# Patient Record
Sex: Female | Born: 1950 | Race: Black or African American | Hispanic: No | Marital: Married | Smoking: Never smoker
Health system: Southern US, Community
[De-identification: ages and names within clinical notes are randomized; demographics above are authoritative.]

## PROBLEM LIST (undated history)

## (undated) DIAGNOSIS — F419 Anxiety disorder, unspecified: Secondary | ICD-10-CM

## (undated) DIAGNOSIS — I10 Essential (primary) hypertension: Secondary | ICD-10-CM

## (undated) DIAGNOSIS — F32A Depression, unspecified: Secondary | ICD-10-CM

## (undated) DIAGNOSIS — E119 Type 2 diabetes mellitus without complications: Secondary | ICD-10-CM

## (undated) DIAGNOSIS — I2510 Atherosclerotic heart disease of native coronary artery without angina pectoris: Secondary | ICD-10-CM

## (undated) DIAGNOSIS — J45909 Unspecified asthma, uncomplicated: Secondary | ICD-10-CM

## (undated) HISTORY — PX: APPENDECTOMY: SHX54

## (undated) HISTORY — DX: Anxiety disorder, unspecified: F41.9

## (undated) HISTORY — DX: Depression, unspecified: F32.A

## (undated) HISTORY — DX: Essential (primary) hypertension: I10

---

## 2016-10-21 DIAGNOSIS — E0590 Thyrotoxicosis, unspecified without thyrotoxic crisis or storm: Secondary | ICD-10-CM | POA: Diagnosis not present

## 2016-10-21 DIAGNOSIS — E559 Vitamin D deficiency, unspecified: Secondary | ICD-10-CM | POA: Diagnosis not present

## 2016-10-21 DIAGNOSIS — Z79899 Other long term (current) drug therapy: Secondary | ICD-10-CM | POA: Diagnosis not present

## 2016-10-21 DIAGNOSIS — I10 Essential (primary) hypertension: Secondary | ICD-10-CM | POA: Diagnosis not present

## 2016-10-21 DIAGNOSIS — R7309 Other abnormal glucose: Secondary | ICD-10-CM | POA: Diagnosis not present

## 2016-10-21 DIAGNOSIS — E7800 Pure hypercholesterolemia, unspecified: Secondary | ICD-10-CM | POA: Diagnosis not present

## 2016-11-03 DIAGNOSIS — Z79899 Other long term (current) drug therapy: Secondary | ICD-10-CM | POA: Diagnosis not present

## 2016-11-03 DIAGNOSIS — F209 Schizophrenia, unspecified: Secondary | ICD-10-CM | POA: Diagnosis not present

## 2016-11-03 DIAGNOSIS — I10 Essential (primary) hypertension: Secondary | ICD-10-CM | POA: Diagnosis not present

## 2016-11-03 DIAGNOSIS — R441 Visual hallucinations: Secondary | ICD-10-CM | POA: Diagnosis not present

## 2016-11-03 DIAGNOSIS — F17200 Nicotine dependence, unspecified, uncomplicated: Secondary | ICD-10-CM | POA: Diagnosis not present

## 2016-11-03 DIAGNOSIS — F989 Unspecified behavioral and emotional disorders with onset usually occurring in childhood and adolescence: Secondary | ICD-10-CM | POA: Diagnosis not present

## 2016-11-03 DIAGNOSIS — F419 Anxiety disorder, unspecified: Secondary | ICD-10-CM | POA: Diagnosis not present

## 2016-11-03 DIAGNOSIS — F29 Unspecified psychosis not due to a substance or known physiological condition: Secondary | ICD-10-CM | POA: Diagnosis not present

## 2016-11-03 DIAGNOSIS — R440 Auditory hallucinations: Secondary | ICD-10-CM | POA: Diagnosis not present

## 2016-11-04 ENCOUNTER — Ambulatory Visit (INDEPENDENT_AMBULATORY_CARE_PROVIDER_SITE_OTHER): Payer: Medicare (Managed Care) | Admitting: Psychiatry

## 2016-11-04 ENCOUNTER — Encounter (HOSPITAL_BASED_OUTPATIENT_CLINIC_OR_DEPARTMENT_OTHER): Payer: Self-pay

## 2016-11-04 VITALS — BP 148/88 | HR 97 | Temp 97.7°F | Ht 60.0 in | Wt 152.0 lb

## 2016-11-04 DIAGNOSIS — F22 Delusional disorders: Secondary | ICD-10-CM

## 2016-11-04 DIAGNOSIS — F411 Generalized anxiety disorder: Secondary | ICD-10-CM

## 2016-11-04 MED ORDER — HALOPERIDOL 2 MG PO TABS
2.0000 mg | ORAL_TABLET | Freq: Every evening | ORAL | 2 refills | Status: DC
Start: 2016-11-04 — End: 2017-01-18

## 2016-11-04 MED ORDER — BUSPIRONE HCL 15 MG PO TABS
15.0000 mg | ORAL_TABLET | Freq: Two times a day (BID) | ORAL | 2 refills | Status: DC
Start: 2016-11-04 — End: 2017-01-18

## 2016-11-04 NOTE — Patient Instructions (Signed)
MEDICATION INSTRUCTIONS                    1. Take all medications as prescribed; do not stop medications or change dosages without talking to your provider(s).  2. Abstain from alcohol and/or illegal drugs as they interfere with psychiatric medications.   3. Immediately go to the nearest emergency department or call 911 if you have any thoughts of wanting to harm yourself or others, or for any other crisis.  4. Consult with your pharmacist if you questions about your medications, their side effects or possible interactions with other medications you take.      FOLLOW-UP CARE APPOINTMENTS    PSYCHIATRIC MEDICATION MANAGEMENT: Start taking Haldol 2 mg one pill at night. and Continue taking Buspar 15 mg twice a day  1. PSYCHOTHERAPY: If you do not already have a therapist, find one by contact your insurance provider for a list of in-network providers. Time-limited, psychotherapy programs are available through [place] by calling [phone]. Another source for therapists is http://www.psychology-today.com/.  2. Return to [facility] in prn, or anytime if necessary (but not for routine matters like refills). [facility] Walk-in hours are 10a-6p Monday thru Saturday, excluding Thanksgiving, Christmas and New Years Day. We close early at 3pm on the first Wednesday of each month for staff meetings.      ABOUT YOUR MEDICATIONS:     Haloperidol Oral tablet  What is this medicine?  HALOPERIDOL (ha loe PER i dole) is used to treat schizophrenia. This medicine is also used to control tics and vocal outbursts in patients with Tourette's syndrome and treat behavioral problems in children with severe conduct disorders. It should only be used in these children if other medicines have not worked.  This medicine may be used for other purposes; ask your health care provider or pharmacist if you have questions.  What should I tell my health care provider before I take this medicine?  They need to know if you have any of these  conditions:   dementia   head injury   lung disease   Parkinson's disease   an unusual or allergic reaction to haloperidol, tartrazine, other medicines, foods, dyes, or preservatives   pregnant or trying to get pregnant   breast-feeding  How should I use this medicine?  Take this medicine by mouth with a glass of water. Follow the directions on the prescription label. You can take this medicine with or without food. Take your doses at regular intervals. Do not take your medicine more often than directed. Do not suddenly stop taking this medicine. You may need to gradually reduce the dose.  Talk to your pediatrician regarding the use of this medicine in children. Special care may be needed. While this medicine may be prescribed for children for selected conditions, precautions do apply.  Overdosage: If you think you have taken too much of this medicine contact a poison control center or emergency room at once.  NOTE: This medicine is only for you. Do not share this medicine with others.  What if I miss a dose?  If you miss a dose, take it as soon as you can. If it is almost time for your next dose, take only that dose. Do not take double or extra doses.  What may interact with this medicine?  Do not take this medicine with any of the following medications:   arsenic trioxide   certain antibiotics like grepafloxacin, pentamidine, sparfloxacin   certain medicines for fungal infections like  fluconazole, itraconazole, ketoconazole, posaconazole, voriconazole   certain medicines for malaria like chloroquine, halofantrine   certain medicines for irregular heart beat like dofetilide, dronedarone   cisapride   droperidol   levomethadyl   methadone   pimozide   ranolazine   risperidone   thioridazine   ziprasidone  This medicine may also interact with the following medications:   alcohol   atropine   benztropine   cabergoline   carbamazepine   certain medicines for depression, anxiety, or psychotic  disturbances   certain medicines for Parkinson's disease like levodopa   certain medicines that treat or prevent blood clots like warfarin   dicyclomine   lithium   narcotic medicines for pain   other medicines that prolong the QT interval (cause an abnormal heart rhythm)   promethazine   rifampin  This list may not describe all possible interactions. Give your health care provider a list of all the medicines, herbs, non-prescription drugs, or dietary supplements you use. Also tell them if you smoke, drink alcohol, or use illegal drugs. Some items may interact with your medicine.  What should I watch for while using this medicine?  Visit your doctor or health care professional for regular checks on your progress. It may be a few weeks before you see the full effects of this medicine.  You may get dizzy or drowsy or have blurred vision. Do not drive, use machinery, or do anything that needs mental alertness until you know how this medicine affects you. Do not stand or sit up quickly, especially if you are an older patient. This reduces the risk of dizzy or fainting spells. Alcohol can increase dizziness and drowsiness. Avoid alcoholic drinks.  Do not treat yourself for colds, diarrhea or allergies. Ask your doctor or health care professional for advice, some nonprescription medicines may increase possible side effects.  Your mouth may get dry. Chewing sugarless gum or sucking hard candy, and drinking plenty of water may help. Contact your doctor if the problem does not go away or is severe.  This medicine can reduce the response of your body to heat or cold. Dress warm in cold weather and stay hydrated in hot weather. If possible, avoid extreme temperatures like saunas, hot tubs, very hot or cold showers, or activities that can cause dehydration such as vigorous exercise.  This medicine can make you more sensitive to the sun. Keep out of the sun. If you cannot avoid being in the sun, wear protective clothing  and use sunscreen. Do not use sun lamps or tanning beds/booths.  What side effects may I notice from receiving this medicine?  Side effects that you should report to your doctor or health care professional as soon as possible:   breast pain or swelling or unusual production of breast milk   confusion   difficulty breathing   difficulty in speaking or swallowing   difficulty passing urine, or sudden loss of bladder control   dizziness or light headedness   fast or irregular heartbeat   fever, chills, or sore throat   hot, dry skin or lack of sweating   loss of balance or difficulty walking   seizures   skin rash   stiffness, spasms, trembling   uncontrollable tongue or chewing movements, smacking lips or puffing cheeks   uncontrollable muscle spasms, in the face hands, arms, or legs, twisting body movements   unusually weak or tired  Side effects that usually do not require medical attention (report to your doctor or  health care professional if they continue or are bothersome):   anxiety or agitation   constipation or diarrhea   decreased sexual ability   menstrual changes   nausea or vomiting   weight gain  This list may not describe all possible side effects. Call your doctor for medical advice about side effects. You may report side effects to FDA at 1-800-FDA-1088.  Where should I keep my medicine?  Keep out of the reach of children.  Store at room temperature between 15 and 30 degrees C (59 and 86 degrees F). Protect from light. Keep container tightly closed. Throw away any unused medicine after the expiration date.  NOTE:This sheet is a summary. It may not cover all possible information. If you have questions about this medicine, talk to your doctor, pharmacist, or health care provider. Copyright 2015 Gold Standard              [place] [facility]    [facility] Call Center  - [phone] 24/7/365  For admissions and screening for all [facility] Services,  including:   Comprehensive Addiction Treatment Services (CATS) Inpatient Detox, IOP Intensive Outpatient Programs   Partial Hospitalization Program (PHP), Outpatient psychiatry, Outpatient counseling        [facility]  [facility] Psychiatric Assessment Center  [address]  [place], [place] [address] For Urgent Adult (18 and Over) Psychiatric Assessments [phone]     [facility]  [address]  [place], [place] [address]   For Child and Youth (Under 18) Mental Health and Substance Abuse Outpatient Services [phone]   [facility] [place]  [address]  [place], [place] [address] For Non-Urgent Psychiatric Appointments: [phone]   [facility]-   [facility]  [address]  [place], [place] [address]   For Non-Urgent Psychiatric Appointments: [phone]   [facility]  [address] [place], [place] [address]   For Non-Urgent Psychiatric Appointments: [phone]   [facility]- [address]  [address]  [place], [place] [address]   For Non-Urgent Psychiatric Appointments: [phone]   [facility] [place]  [address]   [place], [place] [address]   For Non-Urgent Psychiatric Appointments: [phone]         COMMUNITY RESOURCES (MENTAL HEALTH CENTERS):      [facility]  Entry and Referral Services [phone]     [facility], [place], [place] [phone]   [facility], [place], [place] [phone]     [facility], [place], [place] [phone]     [facility], [place], [place] [phone]     [facility], [place], [place]  [phone]       [facility],  [place], [place] [phone]     [facility], [place], [place]  [phone]       [facility] [address]  [place]) [phone]              [facility] - [phone]

## 2016-11-04 NOTE — Progress Notes (Signed)
[facility] Psychiatric Assessment Center [facility]) Walk-In Evaluation    Date/Time:  11/04/2016  4:13 PM  Patient Name: ARABIA, NYLUND  MRN:  [id number]  Age: 65 y.o.  DOB: 12/16/1950    PART-1  TRIAGE     Vital Signs:     Vitals:    11/04/16 1608   BP: 148/88   Pulse: 97   Temp: 97.7 F (36.5 C)   SpO2: 97%       Presenting problem/Expectations for today's visit ( brief)    "discharged from ER yesterday for schizophrenia.Was told to come here." Daughter gave a note saying "she gets nervous and shakes, paranoid people are watching her, trying to kill her. Has gotten worse recently and talks out of her head."    Screening questions:   Current or recent Suicidal ideation/intent or plan: No  Current or recent Homicidal ideation/intent or plan:No    Any drugs: No  Alcohol: No  Any Withdrawal symptoms:N/A    Hallucinations: No      Pt will be seen by next available provider.    Melissa Noon

## 2016-11-04 NOTE — Progress Notes (Signed)
[facility] Behavioral Health Psychiatric Evaluation    Date/Time:   11/04/2016  4:46 PM  Name:  Rebecca Harding, SPOERL  MRN:    [id number]  Age:   65 y.o.  DOB:   [birth year]  Sex:  female    CHIEF COMPLAINT  " She brought me here "    HISTORY OF PRESENT ILLNESS  Patient is a 65 year old married AA female, having psychiatric treatment from her primary care doctor Dr, currently taking BuSpar 15 mg twice a day for the diagnosis of anxiety and depression. Her daughter reports that she believes her mother has more than that.  It seems that patient has family dynamics going on as well. Pt and her husband have homes in [place] and another one in [place], where they stay alternatively in both places, currently husband is in [place] and she is staying in [place]. She seems to be nervous and some trembling/shakes in hands and legs, later daughter and the patient states that she has been like that for many years.  Patient states that her daughter is the one who brought up over here because of "yesterday situation".  Patient is a poor historian, vague and guarded, and she does that like being addressed or being targeted by the others, and she is unable to give the details story.  With the patient's permission, the writer talks with her daughter who gives the following history.  Patient's daughter called  the police from her job yesterday, to the patient's home. Patient  Reportedly refused to open the door, barricaded from inside with a stick , yesterday. Patient was found to be marching around like a soldier, then she was screaming, few days before.  [name] another daughter and the grand son were at the patient's place,who called that the sister at the job reporting about the situation.  Patient denies most of the reports, and defends herself that she is not barricading to the police, she was studying the bible inside.  She has a stick that she used to barricade the door at nights and it was there.  According to the daughter, patient has been scared  and was talking out of her head; that she has wire in the ears, about people watching her, people were trying to kill her and the husband in is the one who is trying to peep her, shinning her with the TV remote control,  ridiculing and laughing at her. Pt says that she was scared and nervous about electrical things.  Reportedly, the patient's husband and is handling a lot of electrical cords and materia's, cutting the wires and then patient has been scared of having the 'harmful electron or electrical transmittion to her'  .  Patient reportedly is very scared that that TV or cell phone or electrical equipments/things are going to transmit dangerous harmful  particles to her.  At home she covered the termo most that with the wash clothes.  She states that she is scared as young people are going into her house and taking pictures of everything inside her house and these pics could be ended up in the drug dealers and then police officer, and the police would thought that she is involved with the drug dealers.  Patient admits that she thinks the people are watching her, people are trying to get into her place and people are trying to harm her, but she does not think they are going to kill her. Says she does not like being around the cell phone because she thinks that  the cell phones recording her. Patient denies other psychotic features like hallucinations.  And daughter says that her mother has been okay until 7 years ago after how favoring nephews was sent to jail for robbery and patient is under stress because 1 of her daughter's son and the son's girlfriends had bad attitude towards her daughter and she doesn't like it.  Pt has strong family psych h/o - her mom has Dx of Schizophrenia, and the daughter has Bipolar and anxiety on 2 meds.  Pt denies having acute stress / acute depression. Pt denies having acute SI/HI, Sd plan/ intent/ behaviors, and the pt contracts for the safety. The pt denies having HHGW  / mood  swings/racing mind / frequent night mares / intrusive thoughts / agitation / physical fights with others. Pt denies having acute sxs of psychosis/ mania/hypomania, denies having  impulsive behaviors and stress associated symptoms of feeling on edge, irritability .  Pt denies using illicit drugs and excessive drinking of alcohol.     Pt has no h/o being abuse, no relationship problem, no prior psych admission. The pt denies having past h/o sd attempt.      PSYCHIATRIC REVIEW OF SYMPTOMS  Subjective Mood: " frustrated "   Sleep: Reports no difficulty sleeping   Appetite/Weight: Normal/Baseline / No known change   Focus & Concentration: Poor Focus/Distractible   Energy Level: Increased   Delusions:  Paranoid: people are watching her, scared that they'll video tape her   Hallucinations: Denies any Hallucinosis   Suicide or Self-Injury?: No   Homicide or Violence?: No   Access to Guns?: No       PAST PSYCHIATRIC HISTORY  Current Provider(s) PMD   Diagnoses Anxiety Disorder, Major Depression   Previous Medications Buspar15 mg bid and rrecent   Hospitalizations No   Suicide Attempts No    Self Injury No   Violence to Others No   Head Injury No   Seizures No   Suicide Exposure No     History reviewed. No pertinent family history.    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 Never Smoker   Smokeless Tobacco   . Never Used      Treatments None     MEDICAL HISTORY    Current/Home Medications    AMLODIPINE (NORVASC) 2.5 MG TABLET    Take 2.5 mg by mouth daily.    BUSPIRONE (BUSPAR) 15 MG TABLET    Take 15 mg by mouth 2 (two) times daily.    LISINOPRIL 20 MG TABS, HYDROCHLOROTHIAZIDE 12.5 MG TABS    Take by mouth daily.    METHIMAZOLE (TAPAZOLE) 5 MG TABLET    Take 5 mg by mouth every  8 (eight) hours.    PRAVASTATIN (PRAVACHOL) 20 MG TABLET    Take 20 mg by mouth daily.    PROPRANOLOL (INDERAL LA) 120 MG 24 HR CAPSULE    Take 120 mg by mouth daily.       Past Medical History:   Diagnosis Date   . Anxiety    . Depression    . Hypertension        History reviewed. No pertinent surgical history.    Allergies  Allergen Reactions   . Penicillins           PSYCHIATRIC SPECIALITY & MENTAL STATUS EXAM  Vital Signs BP 148/88 (BP Site: Left arm, Patient Position: Sitting, Cuff Size: Medium)   Pulse 97   Temp 97.7 F (36.5 C) (Oral)   Ht 1.524 m (5')   Wt 68.9 kg (152 lb)    General Appearance Neatly groomed, appropriately dressed and adequately nourished   Muskuloskeletal No weakness, abnormal movements, or other impairments   Gait/Station  No impairments to gait/station   Speech Normal Rate, Rythym, & Volume   Thought Process Logical, Linear, Goal Directed   Associations Circumstantial    Thought Content Paranoia: people are watching and trying to video tape her   Perceptions No evidence of hallucinosis   Judgment No Impairment   Insight  Fair, Limited   LOC/Orientation A & O x 3, Unsure of year - says now is [phone]   Memory Intact   Attention & Concentration Requires Refocusing   Fund of Knowledge Adequate given patient age, socioeconomic status, and educational level   Language Fluent with no impairments in comprehension or expression   Mood frustrated   Affect Anxious, Intense / Over-Expressive       ASSESSMENT  Patient is a 65 year old married AA female, having psychiatric treatment from her primary care doctor Dr, currently taking BuSpar 15 mg twice a day for the diagnosis of anxiety and depression. Her daughter reports that she believes her mother has more than that and according to her reports the pt seems to be having paranoid and weird delusions. Pt's story is still vague and she is in denial of most of the things reported, but the family is concerned about her as she is talking about  driving to [place] by herself alone tomorrow. After counseling, the pt agrees to take adjusted meds and not to drive tomorrow until she becomes stable.  Pt has no acute stress/ crisis. Patient appears well-nourished, well-developed and in no acute distress. Pt denies any active thoughts of self-harm or harm to others  and sd plan and intent, and denies any active perceptual distortions characterizes as auditory or visual type hallucinations.  Pt is free from overt psychosis and mania/hypomania, and is currently appears to be stable. Pt contracts for the safety and the pt is currently free from imminent risks to self and others. Pt has no agitation and violent thoughts. Patient is seeking for establish care and agrees with  medication management.     Discussed indications, risks, benefits of treatment options as well as potential, side effects, risks and alternatives are with the patient in detail. Recommends to take low dose of Haldol and the pt agrees to follow up with treatment recommendations.      Encounter Diagnoses  Name                                                                                     1. Delusional disorder    2. GAD (generalized anxiety disorder)        PLAN  Treatment options and alternatives reviewed with patient, along with detailed discussion of medication(s) and side effects, and  they concur with following plan: Especially potential risks and s/e of Haldol and Buspar. The  patient is agreeable to take medication and signed informed consent. Pt is explained that if the s/e develop he could take half pill for 4-5 days and then resume taking  whole pills again. Pt is advised to take at dinner time, if medication is tiring in the day.       Pt to abstain from alcohol and/or illegal drugs as they can interfere with psychiatric medications   Take all medications as prescribed. Do not take extra medications or stop any medications without first talking to and getting instructions to do so from  your outpatient providers.    Patient would benefit from combination of psych meds and  psychotherapy.     Medications:  1. Start taking Haldol 2 mg one pill at night  2. Continue taking Buspar 15 mg twice a day  Gives 30 days supply with 2 refills    Therapies:   Psychotherapy: Patient declines to engage in psychotherapy        PHP: PHP not clinically relevant at this time    Labs/Other:   none    DISPOSITION & FOLLOW-UP   Discharge to: home  in care of self        Follow-up:  Patient to make f/u appointment with [facility] psychiatrist  outpatient service for    medication management, prior to leaving.   Return to [facility]  Patient agrees with safety plan which includes which includes identifying warning signs, using coping skills and family and social contacts,  and calling 911 and/or going to ER if needed. All questions answered and concerns addressed. Patient aware they can return to [facility]   if the pt is experiencing an emergency or is in crisis.as necessary.   Instructions were reviewed and discussed with patient. All questions answered and concerns addressed.       ____________________________________________  Denyse Amass, MD

## 2016-11-07 DIAGNOSIS — F209 Schizophrenia, unspecified: Secondary | ICD-10-CM | POA: Diagnosis not present

## 2016-11-07 DIAGNOSIS — F29 Unspecified psychosis not due to a substance or known physiological condition: Secondary | ICD-10-CM | POA: Diagnosis not present

## 2016-11-07 DIAGNOSIS — F419 Anxiety disorder, unspecified: Secondary | ICD-10-CM | POA: Diagnosis not present

## 2016-11-07 DIAGNOSIS — Z79899 Other long term (current) drug therapy: Secondary | ICD-10-CM | POA: Diagnosis not present

## 2016-11-07 DIAGNOSIS — Z046 Encounter for general psychiatric examination, requested by authority: Secondary | ICD-10-CM | POA: Diagnosis not present

## 2016-11-07 DIAGNOSIS — F39 Unspecified mood [affective] disorder: Secondary | ICD-10-CM | POA: Diagnosis not present

## 2016-11-07 DIAGNOSIS — F17200 Nicotine dependence, unspecified, uncomplicated: Secondary | ICD-10-CM | POA: Diagnosis not present

## 2016-11-09 DIAGNOSIS — F329 Major depressive disorder, single episode, unspecified: Secondary | ICD-10-CM | POA: Diagnosis not present

## 2016-11-09 DIAGNOSIS — F29 Unspecified psychosis not due to a substance or known physiological condition: Secondary | ICD-10-CM | POA: Diagnosis not present

## 2016-11-09 DIAGNOSIS — G479 Sleep disorder, unspecified: Secondary | ICD-10-CM | POA: Diagnosis not present

## 2016-11-09 DIAGNOSIS — I10 Essential (primary) hypertension: Secondary | ICD-10-CM | POA: Diagnosis not present

## 2016-11-09 DIAGNOSIS — Z9114 Patient's other noncompliance with medication regimen: Secondary | ICD-10-CM | POA: Diagnosis not present

## 2016-11-09 DIAGNOSIS — F250 Schizoaffective disorder, bipolar type: Secondary | ICD-10-CM | POA: Diagnosis not present

## 2016-11-09 DIAGNOSIS — R0789 Other chest pain: Secondary | ICD-10-CM | POA: Diagnosis not present

## 2016-11-09 DIAGNOSIS — F17200 Nicotine dependence, unspecified, uncomplicated: Secondary | ICD-10-CM | POA: Diagnosis not present

## 2016-11-09 DIAGNOSIS — R079 Chest pain, unspecified: Secondary | ICD-10-CM | POA: Diagnosis not present

## 2016-11-10 ENCOUNTER — Encounter (HOSPITAL_BASED_OUTPATIENT_CLINIC_OR_DEPARTMENT_OTHER): Payer: Self-pay | Admitting: Psychiatry

## 2016-11-12 DIAGNOSIS — F250 Schizoaffective disorder, bipolar type: Secondary | ICD-10-CM | POA: Diagnosis not present

## 2016-11-13 DIAGNOSIS — F250 Schizoaffective disorder, bipolar type: Secondary | ICD-10-CM | POA: Diagnosis not present

## 2016-11-14 DIAGNOSIS — F17210 Nicotine dependence, cigarettes, uncomplicated: Secondary | ICD-10-CM | POA: Diagnosis not present

## 2016-11-14 DIAGNOSIS — I10 Essential (primary) hypertension: Secondary | ICD-10-CM | POA: Diagnosis not present

## 2016-11-15 DIAGNOSIS — I10 Essential (primary) hypertension: Secondary | ICD-10-CM | POA: Diagnosis not present

## 2016-12-13 DIAGNOSIS — F250 Schizoaffective disorder, bipolar type: Secondary | ICD-10-CM | POA: Diagnosis not present

## 2016-12-13 DIAGNOSIS — F39 Unspecified mood [affective] disorder: Secondary | ICD-10-CM | POA: Diagnosis not present

## 2016-12-13 DIAGNOSIS — R4589 Other symptoms and signs involving emotional state: Secondary | ICD-10-CM | POA: Diagnosis not present

## 2016-12-13 DIAGNOSIS — J45998 Other asthma: Secondary | ICD-10-CM | POA: Diagnosis not present

## 2016-12-13 DIAGNOSIS — I10 Essential (primary) hypertension: Secondary | ICD-10-CM | POA: Diagnosis not present

## 2016-12-16 DIAGNOSIS — I493 Ventricular premature depolarization: Secondary | ICD-10-CM | POA: Diagnosis not present

## 2016-12-16 DIAGNOSIS — F99 Mental disorder, not otherwise specified: Secondary | ICD-10-CM | POA: Diagnosis not present

## 2016-12-16 DIAGNOSIS — I10 Essential (primary) hypertension: Secondary | ICD-10-CM | POA: Diagnosis not present

## 2016-12-28 ENCOUNTER — Ambulatory Visit (INDEPENDENT_AMBULATORY_CARE_PROVIDER_SITE_OTHER): Payer: Medicare (Managed Care) | Admitting: Psychiatry

## 2016-12-28 DIAGNOSIS — F22 Delusional disorders: Secondary | ICD-10-CM

## 2016-12-28 NOTE — Progress Notes (Signed)
Date of visit: 12/28/2016    Chief complaint:   Chief Complaint   Patient presents with   . Paranoid   . Medication Management       Interval history: Presents much less symptoms than she has most likloey, needs to see soon.   Patient is a 66 y.o. female who presents for their first [facility] outpatient appointment with me.  Records reviewed.      Onset of illness: Since then, she was taking Buspirone. Patient is a 66 year old married AA female poor historian The pt had deep depression and changed behavior with out of reqality, when her grandson was incarcerated for robbery  7 years ago. Since then, she was taking BuSpar 15 mg twice a day for the diagnosis of anxiety by her primary care doctor Dr many years.  Pt and her husband have homes in [place] and another one in [place], where they stay alternatively in both places, currently husband is in [place] and she is movng back and forth.    In early January-2018, the pt reports that she was admitted at the [facility] and just before admission, she was evaluated by Dr Rebecca Harding, who wrote following note, " Patient's daughter called  the police from her job yesterday, to the patient's home. Patient  Reportedly refused to open the door, barricaded from inside with a stick , yesterday. Patient was found to be marching around like a soldier, then she was screaming, few days before." She states all she remember is that while she was studying the Bible, her daughter was bang the door.  Her D informed me that she claims, " the president trup has been messing her meds."    "The patient has been scared and was talking out of her head; that she has wire in the ears, about people watching her, people were trying to kill her and ---"  "Reportedly, the patient's husband and is handling a lot of electrical cords and materia's, cutting the wires and then patient has been scared of having the 'harmful electron or electrical transmittion to her. "  Her husband  Seems to do with  remote control which makes the pt very nervous. Then he says to the pt, " You are crazy, coo coo. "    Recent Condition including Dx, Tx and response: Discharge Dx, Psychosis NOS. Discharge meds: Depakote ER 500 mg q hs, Abilify 15 mg q day.     GM, ? Mental problems    Review of System    Subjective Mood "fine"   Sleep Reports no difficulty sleeping   Appetite/Weight Normal/Baseline / No known change   Concentration Normal if Medicated   Energy Level No complaint/baseline   Psychosis Hallucinations - No evidence of hallucinosis  Delusions - Paranoia: persecutory ideas   Suicidal Suicidal Thoughts? no   Suicidal Plan?  no   Safety Access to Guns? Denies access  Safety: Denies SI, HI, commits to safety   Homicidal Ideations no   Self-Injury Ideations no   Current Substance Abuse no   Medication Compliance yes   Side Efffects of Meds no       PAST PSYCHIATRIC HISTORY  Diagnoses Psychosis NOS   Previous Medications As above   Hospitalizations Yes: X1   Suicide Attempts No    Self Injury No   Violence to Others No         Cognitive Dysfunction No   Seizures No       FAMILY PSYCHIATRIC HISTORY  Mental Illness  [place]   Substance Abuse [place]   Suicides No       MEDICATIONS AND DOSE/ WHEN AND HOW TO TAKE:    Current Outpatient Prescriptions   Medication Sig Dispense Refill   . amLODIPine (NORVASC) 2.5 MG tablet Take 2.5 mg by mouth daily.     . busPIRone (BUSPAR) 15 MG tablet Take 1 tablet (15 mg total) by mouth 2 (two) times daily. 60 tablet 2   . haloperidol (HALDOL) 2 MG tablet Take 1 tablet (2 mg total) by mouth nightly. 30 tablet 2   . lisinopril 20 MG TABS, hydroCHLOROthiazide 12.5 MG TABS Take by mouth daily.     . methIMAzole (TAPAZOLE) 5 MG tablet Take 5 mg by mouth every 8 (eight) hours.     . pravastatin (PRAVACHOL) 20 MG tablet Take 20 mg by mouth daily.     . propranolol (INDERAL LA) 120 MG 24 hr capsule Take 120 mg by mouth daily.       No current facility-administered medications for this visit.          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      Past Medical History:   Diagnosis Date   . Anxiety    . Depression    . Hypertension        No past surgical history on file.       Allergies:   Allergies   Allergen Reactions   . Penicillins        Current medications:    Current Outpatient Prescriptions   Medication Sig Dispense Refill   . amLODIPine (NORVASC) 2.5 MG tablet Take 2.5 mg by mouth daily.     . busPIRone (BUSPAR) 15 MG tablet Take 1 tablet (15 mg total) by mouth 2 (two) times daily. 60 tablet 2   . haloperidol (HALDOL) 2 MG tablet Take 1 tablet (2 mg total) by mouth nightly. 30 tablet 2   . lisinopril 20 MG TABS, hydroCHLOROthiazide 12.5 MG TABS Take by mouth daily.     . methIMAzole (TAPAZOLE) 5 MG tablet Take 5 mg by mouth every 8 (eight) hours.     . pravastatin (PRAVACHOL) 20 MG tablet Take 20 mg by mouth daily.     . propranolol (INDERAL LA) 120 MG 24 hr capsule Take 120 mg by mouth daily.       No current facility-administered medications for this visit.        MENTAL STATUS EXAMINATION  General Appearance Grooming, hygiene, dress is appropriate   Level of Consciousness A&O x 4, Sensorium Clear   Attention & Concentration  Normal, Requires Refocusing   Mini-MSE  Not indicated this visit   Cooperative/Engaged? Cooperative & Engaged   Psychomotor   No weakness, abnormal movements, or other impairments (+) tremor   Speech Normal Rate, Rythym, & Volume   Mood Euthymic, Irritable   Affect Full-Range / Expressive   Eye Contact Appropriate   Memory Intact   Suicidal/Self-Injury No   Homicidal No   Thought Process Logical, Linear, Goal Directed   Thought Content  Delusions: No evidence of homicidal, suicidal, violent, or delusional thought  content  Hallucinations:  No evidence of hallucinosis      Insight & Judgment Fair     Diagnoses:  Other psychotic disorder    Past Medical History:   Diagnosis Date   . Anxiety    . Depression    . Hypertension       No past surgical history on file.     Assessment & Psy ED Med-R done on 12/28/16  Symptoms and signs:  Self care including mental illness:  Cognitive remodeling:  Interpersal Field:     Plan: 66 y.o. female here for first appointment with me     1) Medications - Continue abilify 15 mg q day  Depakote ER 500 mg q hs  The pt and her D does not know exact amount and so ask her to come back. The pt seems to have still psychotic symptoms, but denies them  Will see in 2 weeks and straighten out meds    Risks/benefits/alternatives/side effects to all treatment modalities and medications have been discussedas well as potential side effects and medication   2) Advised patient to abstain from ETOH/Drug use, and to take medications appropriately as prescribed.  3) Studies/ Testing:   4) The patient does not appear to be at imminent risk of self-harm or violence, though any future actions are unpredictable. Patient agrees with safety plan which includes identifying warning signs, using coping skills and family and social contacts, and calling 911to continue with safety plan which includes calling 911 and/or going to ER/ IPAC if the patient is experiencing an emergency or is in crisis     5) Patient also has number to [place]'s 24/7 central access line, and is aware of urgent care/walk-in clinic at the [facility].     Return to clinic or call within 2 weeks or sooner PRN.      Time spent with patient: 60 min    .

## 2017-01-09 ENCOUNTER — Encounter (HOSPITAL_BASED_OUTPATIENT_CLINIC_OR_DEPARTMENT_OTHER): Admitting: Psychiatry

## 2017-01-18 ENCOUNTER — Ambulatory Visit (INDEPENDENT_AMBULATORY_CARE_PROVIDER_SITE_OTHER): Payer: Medicare (Managed Care) | Admitting: Psychiatry

## 2017-01-18 DIAGNOSIS — F411 Generalized anxiety disorder: Secondary | ICD-10-CM

## 2017-01-18 DIAGNOSIS — F22 Delusional disorders: Secondary | ICD-10-CM

## 2017-01-18 MED ORDER — HALOPERIDOL 2 MG PO TABS
2.0000 mg | ORAL_TABLET | Freq: Every evening | ORAL | 2 refills | Status: DC
Start: 2017-01-18 — End: 2017-04-04

## 2017-01-18 MED ORDER — BUSPIRONE HCL 15 MG PO TABS
15.0000 mg | ORAL_TABLET | Freq: Two times a day (BID) | ORAL | 2 refills | Status: DC
Start: 2017-01-18 — End: 2017-04-04

## 2017-01-18 NOTE — Progress Notes (Signed)
[facility]  Behavioral Health Service   Follow-Up/Interval Evaluation    Date/Time:   01/18/2017  5:39 PM  Name:  Rebecca Harding, Rebecca Harding  MRN:    [id number]  Age:   66 y.o.  DOB:   1951/09/29    Chief Complaint   Patient presents with   . Medication Management   . Anxiety   . Paranoid     Background Information: : . Patient is a 66 year old married AAfemale poor historian The pt had deep depression and changed behavior with out of reqality, when her grandson was incarcerated for robbery  7 years ago. Since then, she was taking BuSpar 15 mg twice a day for the diagnosis of anxiety by herprimary care doctor Dr many years.  Pt and her husband have homes in [place] and another one in [place], where they stay alternatively in both places, currently husband is in [place] and she is movng back and forth.   In early January-2018, the pt reports that she was admitted at the [facility] and just before admission, she was evaluated by Dr Harley Alto, who wrote following note, " Patient'sdaughter called the police from her job yesterday,to the patient's home. Patient Reportedly refused to open the door,barricaded from insidewith a stick , yesterday. Patient was found to be marching around like a soldier,then she was screaming, few days before." She states all she remember is that while she was studying the Bible, her daughter was bang the door.  Her D informed me that she claims, " the president trup has been messing her meds."    "The patient has been scared and was talking out of her head; that she has wire in the ears, about people watching her, people were trying to kill her and ---"  "Reportedly, the patient's husband and is handling a lot of electrical cords and materia's, cutting the wires and then patient has been scared ofhaving the 'harmful electron or electrical transmittion to her. " Her husband  Seems to do with remote control which makes the pt very nervous. Then he says to the pt, " You are crazy, coo  coo. "--- Discharge Dx, Psychosis NOS. Discharge meds: Depakote ER 500 mg q hs, Abilify 15 mg q day.       INTERVAL HISTORY Reside with husband Housewife 4 kids from 29 yo to 8 Yo in 2018. 51 GK, 7 GGK.     Has been doing well. Denies any mental symptoms    Target Issues and symptoms    #1. Depression:  PHQ  on //16  #2. Paranoid delusion + hallucinations: Denies them  #3. Difficult  Life course and painful memory: Stressful, now concentrating on gardening. Brother got killed. "I am oldest and took care of 5 siblings,one of whom was killed. Mother role of her sibling as her mother worked.  #5. Coping with stress:   #6. Relationships    Review of System  Subjective Mood "okay"   Neurovegetative Symptoms Ok    Concentration Normal if Medicated   Psychosis Hallucinations - No evidence of hallucinosis  Delusions - No evidence of homicidal, suicidal, violent, or delusional thought content   Suicidal Suicidal Thoughts? no   Suicidal Plan?  no   Safety Access to Guns? Denies access  Safety: Denies SI, HI, commits to safety   Homicidal Ideations no   Self-Injury Ideations no   Current Substance Abuse no   Medication Compliance yes   Side Effects of Meds none  MENTAL STATUS EXAMINATION  General Appearance Neatly groomed, appropriately dressed and adequately nourished   Level of Consciousness A&O x 4, Sensorium Clear   Attention & Concentration  Normal   Mini-MSE  Not indicated this visit   Cooperative/Engaged? Cooperative & Engaged   Psychomotor   No weakness, abnormal movements, or other impairments   Speech Normal Rate, Rythym, & Volume   Mood Euthymic   Affect Full-Range / Expressive, Blunted / Flat   Eye Contact Appropriate   Memory Intact   Suicidal/Self-Injury No   Homicidal No   Thought Process Logical, Linear, Goal Directed   Thought Content  Delusions: No evidence of homicidal, suicidal, violent, or delusional thought content  Hallucinations: No evidence of hallucinosis      Insight & Judgment Fair        ASSESSMENT:    66 y.o. female with GAD + Psychosis NOS has been recovering to her self.    Encounter Diagnoses   Name Primary?   Marland Kitchen GAD (generalized anxiety disorder) Yes   . Delusional disorder        MEDICATIONS AND DOSE/ WHEN AND HOW TO TAKE:    Current Outpatient Prescriptions   Medication Sig Dispense Refill   . amLODIPine (NORVASC) 2.5 MG tablet Take 2.5 mg by mouth daily.     . busPIRone (BUSPAR) 15 MG tablet Take 1 tablet (15 mg total) by mouth 2 (two) times daily. 60 tablet 2   . haloperidol (HALDOL) 2 MG tablet Take 1 tablet (2 mg total) by mouth nightly. 30 tablet 2   . lisinopril 20 MG TABS, hydroCHLOROthiazide 12.5 MG TABS Take by mouth daily.     . methIMAzole (TAPAZOLE) 5 MG tablet Take 5 mg by mouth every 8 (eight) hours.     . pravastatin (PRAVACHOL) 20 MG tablet Take 20 mg by mouth daily.     . propranolol (INDERAL LA) 120 MG 24 hr capsule Take 120 mg by mouth daily.       No current facility-administered medications for this visit.          Reviewed with patient: Med-R   Medications -   Haldol 2 mg q day  Buspirone 15 mg BID  Risks/benefits/alternatives/side effects to all treatment modalities and medications have been discussedas well as potential side effects and medication interactions and pt has been given informed consent/signed documented.   Self Care  Cognitive remodeling  Interpersonal Field:     PLAN:    1) Psych ED:   2) Advised patient to abstain from ETOH/Drug use, and to take medications appropriately as prescribed.  3) The patient does not appear to be at imminent risk of self-harm or violence, though any future actions are unpredictable. Patient agrees with safety plan which includes identifying warning signs, using coping skills and family and social contacts, and calling 911to continue with safety plan which includes calling 911 and/or going to ER/ IPAC if the patient is experiencing an emergency or is in crisis     4) Patient also has number to [place]'s 24/7 central access  line, and is aware of urgent care/walk-in clinic at the [facility].       Return to clinic or call within 2 month or sooner PRN.      Time spent with patient: 30 min  .

## 2017-01-31 DIAGNOSIS — Z79899 Other long term (current) drug therapy: Secondary | ICD-10-CM | POA: Diagnosis not present

## 2017-01-31 DIAGNOSIS — J4521 Mild intermittent asthma with (acute) exacerbation: Secondary | ICD-10-CM | POA: Diagnosis not present

## 2017-01-31 DIAGNOSIS — I10 Essential (primary) hypertension: Secondary | ICD-10-CM | POA: Diagnosis not present

## 2017-01-31 DIAGNOSIS — I493 Ventricular premature depolarization: Secondary | ICD-10-CM | POA: Diagnosis not present

## 2017-01-31 DIAGNOSIS — Z0000 Encounter for general adult medical examination without abnormal findings: Secondary | ICD-10-CM | POA: Diagnosis not present

## 2017-01-31 DIAGNOSIS — F99 Mental disorder, not otherwise specified: Secondary | ICD-10-CM | POA: Diagnosis not present

## 2017-02-14 DIAGNOSIS — J4521 Mild intermittent asthma with (acute) exacerbation: Secondary | ICD-10-CM | POA: Diagnosis not present

## 2017-02-14 DIAGNOSIS — I10 Essential (primary) hypertension: Secondary | ICD-10-CM | POA: Diagnosis not present

## 2017-03-06 ENCOUNTER — Other Ambulatory Visit (INDEPENDENT_AMBULATORY_CARE_PROVIDER_SITE_OTHER): Payer: Self-pay | Admitting: Psychiatry

## 2017-03-06 ENCOUNTER — Telehealth (HOSPITAL_BASED_OUTPATIENT_CLINIC_OR_DEPARTMENT_OTHER): Payer: Self-pay

## 2017-03-06 NOTE — Telephone Encounter (Signed)
Returned patient's call earlier regarding med refill. Left message for to return the call.

## 2017-04-04 ENCOUNTER — Ambulatory Visit (INDEPENDENT_AMBULATORY_CARE_PROVIDER_SITE_OTHER): Payer: Medicare (Managed Care) | Admitting: Psychiatry

## 2017-04-04 DIAGNOSIS — F22 Delusional disorders: Secondary | ICD-10-CM

## 2017-04-04 DIAGNOSIS — F411 Generalized anxiety disorder: Secondary | ICD-10-CM

## 2017-04-04 MED ORDER — HALOPERIDOL 2 MG PO TABS
2.0000 mg | ORAL_TABLET | Freq: Every evening | ORAL | 1 refills | Status: AC
Start: 2017-04-04 — End: ?

## 2017-04-04 MED ORDER — BUSPIRONE HCL 15 MG PO TABS
15.0000 mg | ORAL_TABLET | Freq: Two times a day (BID) | ORAL | 1 refills | Status: AC
Start: 2017-04-04 — End: ?

## 2017-04-04 NOTE — Progress Notes (Signed)
[facility]  Behavioral Health Service   Follow-Up/Interval Evaluation    Date/Time:   04/04/2017  3:13 PM  Name:  Rebecca Harding, Rebecca Harding  MRN:    [id number]  Age:   66 y.o.  DOB:   1951-04-02    Chief Complaint   Patient presents with   . Medication Management   . Anxiety   . Paranoid     Background Information: :. Patient is a 66 year old married AAfemale poor historian The pt had deep depression and changed behavior with out of reqality, when her grandson was incarcerated for robbery 7 years ago. Since then, she was taking BuSpar 15 mg twice a day for the diagnosis of anxiety byherprimary care doctor Dr many years. Pt and her husband have homes in [place] and another one in [place], where they stay alternatively in both places, currently husband is in [place] and she is movng back and forth. In early January-2018, the pt reports that she was admitted at the [facility] and just before admission, she was evaluated by Dr Harley Alto, who wrote following note, " Patient'sdaughter called the police from her job yesterday,to the patient's home. Patient Reportedly refused to open the door,barricaded from insidewith a stick , yesterday. Patient was found to be marching around like a soldier,then she was screaming, few days before." She states all she remember is that while she was studying the Bible, her daughter was bang the door. Her D informed me that she claims, " the president trup has been messing her meds." "Thepatient has been scared and was talking out of her head; that she has wire in the ears, about people watching her, people were trying to kill her and ---" "Reportedly, the patient's husband and is handling a lot of electrical cords and materia's, cutting the wires and then patient has been scared ofhaving the 'harmful electron or electrical transmittion to her. " Her husband Seems to do with remote control which makes the pt very nervous. Then he says to the pt, " You are crazy, coo  coo. "---Discharge Dx, Psychosis NOS. Discharge meds: Depakote ER 500 mg q hs, Abilify 15 mg q day.       INTERVAL HISTORY Reside with husband Housewife 4 kids from 15 yo to 52 Yo in 2018. 9 GK, 7 GGK.     Doing well, but developed tremer.     Target Issues and symptoms    #1. Depression:  PHQ 0 on 04/04/17  #2. Paranoid delusion + hallucinations: Denies them  #3. Difficult  Life course and painful memory: Stressful, now concentrating on gardening. Brother got killed. "I am oldest and took care of 5 siblings,one of whom was killed. Mother role of her sibling as her mother worked.  #5. Coping with stress:   #6. Relationships    Review of System  Subjective Mood "fine"   Neurovegetative Symptoms ok   Concentration Normal if Medicated   Psychosis Hallucinations - No evidence of hallucinosis  Delusions - No evidence of homicidal, suicidal, violent, or delusional thought content   Suicidal Suicidal Thoughts? no   Suicidal Plan?  no   Safety Access to Guns? Denies access  Safety: Denies SI, HI, commits to safety   Homicidal Ideations no   Self-Injury Ideations no   Current Substance Abuse no   Medication Compliance yes   Side Effects of Meds none       MENTAL STATUS EXAMINATION  General Appearance Neatly groomed, appropriately dressed and adequately nourished   Level of Consciousness  A&O x 4, Sensorium Clear   Attention & Concentration  Normal   Mini-MSE  Not indicated this visit   Cooperative/Engaged? Cooperative & Engaged   Psychomotor   No weakness, abnormal movements, or other impairments   Speech Normal Rate, Rythym, & Volume   Mood Euthymic   Affect Full-Range / Expressive   Eye Contact Appropriate   Memory Intact   Suicidal/Self-Injury No   Homicidal No   Thought Process Logical, Linear, Goal Directed   Thought Content  Delusions: No evidence of homicidal, suicidal, violent, or delusional thought content  Hallucinations: No evidence of hallucinosis      Insight & Judgment Fair       ASSESSMENT:    66 y.o.  female with Delusional Disorder + GAD has been at her baseline. However she developed significant tremor. She was tried with Abilify without benefit. Will try to reduce the dose of Haldol and see the progress. May add Cogentin    Encounter Diagnoses   Name Primary?   . Delusional disorder    . GAD (generalized anxiety disorder)        MEDICATIONS AND DOSE/ WHEN AND HOW TO TAKE:    Current Outpatient Prescriptions   Medication Sig Dispense Refill   . amLODIPine (NORVASC) 2.5 MG tablet Take 2.5 mg by mouth daily.     . busPIRone (BUSPAR) 15 MG tablet Take 1 tablet (15 mg total) by mouth 2 (two) times daily. 60 tablet 2   . haloperidol (HALDOL) 2 MG tablet Take 1 tablet (2 mg total) by mouth nightly. 30 tablet 2   . lisinopril 20 MG TABS, hydroCHLOROthiazide 12.5 MG TABS Take by mouth daily.     . methIMAzole (TAPAZOLE) 5 MG tablet Take 5 mg by mouth every 8 (eight) hours.     . pravastatin (PRAVACHOL) 20 MG tablet Take 20 mg by mouth daily.     . propranolol (INDERAL LA) 120 MG 24 hr capsule Take 120 mg by mouth daily.       No current facility-administered medications for this visit.          Reviewed with patient: Med-R   Medications -   Haldol 2 mg q day: reduce to 1mg  hs due to tremor and educated what to do if having recurence of paranoid thoughts.   Buspirone 15 mg BID  Risks/benefits/alternatives/side effects to all treatment modalities and medications have been discussedas well as potential side effects and medication interactions and pt has been given informed consent/signed documented.   Self Care [place] on 5/29/18Cognitive remodeling  Interpersonal Field:     PLAN:    1) Psych ED:   2) Advised patient to abstain from ETOH/Drug use, and to take medications appropriately as prescribed.  3) The patient does not appear to be at imminent risk of self-harm or violence, though any future actions are unpredictable. Patient agrees with safety plan which includes identifying warning signs, using coping skills and  family and social contacts, and calling 911to continue with safety plan which includes calling 911 and/or going to ER/ IPAC if the patient is experiencing an emergency or is in crisis     4) Patient also has number to [place]'s 24/7 central access line, and is aware of urgent care/walk-in clinic at the [facility].       Return to clinic or call within 1 month or sooner PRN.      Time spent with patient: 30 min  .

## 2017-05-16 ENCOUNTER — Ambulatory Visit (INDEPENDENT_AMBULATORY_CARE_PROVIDER_SITE_OTHER): Payer: Medicare (Managed Care) | Admitting: Psychiatry

## 2017-06-07 DIAGNOSIS — F250 Schizoaffective disorder, bipolar type: Secondary | ICD-10-CM | POA: Diagnosis not present

## 2017-06-07 DIAGNOSIS — R4589 Other symptoms and signs involving emotional state: Secondary | ICD-10-CM | POA: Diagnosis not present

## 2017-06-07 DIAGNOSIS — I10 Essential (primary) hypertension: Secondary | ICD-10-CM | POA: Diagnosis not present

## 2017-06-07 DIAGNOSIS — F39 Unspecified mood [affective] disorder: Secondary | ICD-10-CM | POA: Diagnosis not present

## 2017-06-07 DIAGNOSIS — J45998 Other asthma: Secondary | ICD-10-CM | POA: Diagnosis not present

## 2017-07-04 ENCOUNTER — Ambulatory Visit (INDEPENDENT_AMBULATORY_CARE_PROVIDER_SITE_OTHER): Payer: Medicare (Managed Care) | Admitting: Psychiatry

## 2017-07-04 DIAGNOSIS — Z029 Encounter for administrative examinations, unspecified: Secondary | ICD-10-CM

## 2017-07-28 DIAGNOSIS — F209 Schizophrenia, unspecified: Secondary | ICD-10-CM | POA: Diagnosis not present

## 2017-07-28 DIAGNOSIS — F17210 Nicotine dependence, cigarettes, uncomplicated: Secondary | ICD-10-CM | POA: Diagnosis not present

## 2017-07-28 DIAGNOSIS — F419 Anxiety disorder, unspecified: Secondary | ICD-10-CM | POA: Diagnosis not present

## 2017-07-28 DIAGNOSIS — K625 Hemorrhage of anus and rectum: Secondary | ICD-10-CM | POA: Diagnosis not present

## 2017-07-28 DIAGNOSIS — I10 Essential (primary) hypertension: Secondary | ICD-10-CM | POA: Diagnosis not present

## 2017-07-28 DIAGNOSIS — Z79899 Other long term (current) drug therapy: Secondary | ICD-10-CM | POA: Diagnosis not present

## 2017-07-28 DIAGNOSIS — J45909 Unspecified asthma, uncomplicated: Secondary | ICD-10-CM | POA: Diagnosis not present

## 2017-08-16 DIAGNOSIS — I10 Essential (primary) hypertension: Secondary | ICD-10-CM | POA: Diagnosis not present

## 2017-08-16 DIAGNOSIS — Z13220 Encounter for screening for lipoid disorders: Secondary | ICD-10-CM | POA: Diagnosis not present

## 2017-08-18 DIAGNOSIS — J22 Unspecified acute lower respiratory infection: Secondary | ICD-10-CM | POA: Diagnosis not present

## 2017-08-18 DIAGNOSIS — Z23 Encounter for immunization: Secondary | ICD-10-CM | POA: Diagnosis not present

## 2017-08-18 DIAGNOSIS — I10 Essential (primary) hypertension: Secondary | ICD-10-CM | POA: Diagnosis not present

## 2017-08-18 DIAGNOSIS — K047 Periapical abscess without sinus: Secondary | ICD-10-CM | POA: Diagnosis not present

## 2017-10-18 DIAGNOSIS — J45998 Other asthma: Secondary | ICD-10-CM | POA: Diagnosis not present

## 2017-10-18 DIAGNOSIS — F39 Unspecified mood [affective] disorder: Secondary | ICD-10-CM | POA: Diagnosis not present

## 2017-10-18 DIAGNOSIS — R4589 Other symptoms and signs involving emotional state: Secondary | ICD-10-CM | POA: Diagnosis not present

## 2017-10-18 DIAGNOSIS — I10 Essential (primary) hypertension: Secondary | ICD-10-CM | POA: Diagnosis not present

## 2017-10-18 DIAGNOSIS — F250 Schizoaffective disorder, bipolar type: Secondary | ICD-10-CM | POA: Diagnosis not present

## 2017-10-21 DIAGNOSIS — J069 Acute upper respiratory infection, unspecified: Secondary | ICD-10-CM | POA: Diagnosis not present

## 2017-10-21 DIAGNOSIS — J45991 Cough variant asthma: Secondary | ICD-10-CM | POA: Diagnosis not present

## 2017-10-21 DIAGNOSIS — J45909 Unspecified asthma, uncomplicated: Secondary | ICD-10-CM | POA: Diagnosis not present

## 2017-10-24 DIAGNOSIS — J22 Unspecified acute lower respiratory infection: Secondary | ICD-10-CM | POA: Diagnosis not present

## 2017-10-24 DIAGNOSIS — F17210 Nicotine dependence, cigarettes, uncomplicated: Secondary | ICD-10-CM | POA: Diagnosis not present

## 2017-11-21 DIAGNOSIS — R251 Tremor, unspecified: Secondary | ICD-10-CM | POA: Diagnosis not present

## 2017-11-21 DIAGNOSIS — I10 Essential (primary) hypertension: Secondary | ICD-10-CM | POA: Diagnosis not present

## 2017-11-21 DIAGNOSIS — F411 Generalized anxiety disorder: Secondary | ICD-10-CM | POA: Diagnosis not present

## 2017-11-24 DIAGNOSIS — J22 Unspecified acute lower respiratory infection: Secondary | ICD-10-CM | POA: Diagnosis not present

## 2017-12-18 DIAGNOSIS — J22 Unspecified acute lower respiratory infection: Secondary | ICD-10-CM | POA: Diagnosis not present

## 2018-01-31 DIAGNOSIS — F39 Unspecified mood [affective] disorder: Secondary | ICD-10-CM | POA: Diagnosis not present

## 2018-02-13 DIAGNOSIS — F39 Unspecified mood [affective] disorder: Secondary | ICD-10-CM | POA: Diagnosis not present

## 2018-02-13 DIAGNOSIS — Z79899 Other long term (current) drug therapy: Secondary | ICD-10-CM | POA: Diagnosis not present

## 2018-02-13 DIAGNOSIS — F250 Schizoaffective disorder, bipolar type: Secondary | ICD-10-CM | POA: Diagnosis not present

## 2018-02-16 DIAGNOSIS — R7301 Impaired fasting glucose: Secondary | ICD-10-CM | POA: Diagnosis not present

## 2018-02-20 DIAGNOSIS — R0982 Postnasal drip: Secondary | ICD-10-CM | POA: Diagnosis not present

## 2018-02-20 DIAGNOSIS — R7303 Prediabetes: Secondary | ICD-10-CM | POA: Diagnosis not present

## 2018-02-20 DIAGNOSIS — I10 Essential (primary) hypertension: Secondary | ICD-10-CM | POA: Diagnosis not present

## 2018-02-20 DIAGNOSIS — J309 Allergic rhinitis, unspecified: Secondary | ICD-10-CM | POA: Diagnosis not present

## 2018-02-21 DIAGNOSIS — R202 Paresthesia of skin: Secondary | ICD-10-CM | POA: Diagnosis not present

## 2018-02-21 DIAGNOSIS — G250 Essential tremor: Secondary | ICD-10-CM | POA: Diagnosis not present

## 2018-03-06 DIAGNOSIS — R202 Paresthesia of skin: Secondary | ICD-10-CM | POA: Diagnosis not present

## 2018-03-27 DIAGNOSIS — Z1231 Encounter for screening mammogram for malignant neoplasm of breast: Secondary | ICD-10-CM | POA: Diagnosis not present

## 2018-05-29 DIAGNOSIS — R4589 Other symptoms and signs involving emotional state: Secondary | ICD-10-CM | POA: Diagnosis not present

## 2018-05-29 DIAGNOSIS — F39 Unspecified mood [affective] disorder: Secondary | ICD-10-CM | POA: Diagnosis not present

## 2018-05-29 DIAGNOSIS — I10 Essential (primary) hypertension: Secondary | ICD-10-CM | POA: Diagnosis not present

## 2018-05-29 DIAGNOSIS — J45998 Other asthma: Secondary | ICD-10-CM | POA: Diagnosis not present

## 2018-06-13 DIAGNOSIS — G250 Essential tremor: Secondary | ICD-10-CM | POA: Diagnosis not present

## 2018-08-23 DIAGNOSIS — R7303 Prediabetes: Secondary | ICD-10-CM | POA: Diagnosis not present

## 2018-08-28 DIAGNOSIS — Z0000 Encounter for general adult medical examination without abnormal findings: Secondary | ICD-10-CM | POA: Diagnosis not present

## 2018-08-28 DIAGNOSIS — E7800 Pure hypercholesterolemia, unspecified: Secondary | ICD-10-CM | POA: Diagnosis not present

## 2018-08-28 DIAGNOSIS — R7303 Prediabetes: Secondary | ICD-10-CM | POA: Diagnosis not present

## 2018-08-28 DIAGNOSIS — I10 Essential (primary) hypertension: Secondary | ICD-10-CM | POA: Diagnosis not present

## 2018-08-28 DIAGNOSIS — Z23 Encounter for immunization: Secondary | ICD-10-CM | POA: Diagnosis not present

## 2018-08-28 DIAGNOSIS — R7301 Impaired fasting glucose: Secondary | ICD-10-CM | POA: Diagnosis not present

## 2018-09-04 DIAGNOSIS — I10 Essential (primary) hypertension: Secondary | ICD-10-CM | POA: Diagnosis not present

## 2018-10-10 DIAGNOSIS — R251 Tremor, unspecified: Secondary | ICD-10-CM | POA: Diagnosis not present

## 2018-10-10 DIAGNOSIS — G250 Essential tremor: Secondary | ICD-10-CM | POA: Diagnosis not present

## 2018-12-10 DIAGNOSIS — F250 Schizoaffective disorder, bipolar type: Secondary | ICD-10-CM | POA: Diagnosis not present

## 2019-02-15 DIAGNOSIS — G250 Essential tremor: Secondary | ICD-10-CM | POA: Diagnosis not present

## 2019-02-15 DIAGNOSIS — F99 Mental disorder, not otherwise specified: Secondary | ICD-10-CM | POA: Diagnosis not present

## 2019-02-15 DIAGNOSIS — R251 Tremor, unspecified: Secondary | ICD-10-CM | POA: Diagnosis not present

## 2019-02-15 DIAGNOSIS — C8331 Diffuse large B-cell lymphoma, lymph nodes of head, face, and neck: Secondary | ICD-10-CM | POA: Diagnosis not present

## 2019-03-13 DIAGNOSIS — E7800 Pure hypercholesterolemia, unspecified: Secondary | ICD-10-CM | POA: Diagnosis not present

## 2019-03-13 DIAGNOSIS — E119 Type 2 diabetes mellitus without complications: Secondary | ICD-10-CM | POA: Diagnosis not present

## 2019-03-13 DIAGNOSIS — I10 Essential (primary) hypertension: Secondary | ICD-10-CM | POA: Diagnosis not present

## 2019-03-13 DIAGNOSIS — Z1329 Encounter for screening for other suspected endocrine disorder: Secondary | ICD-10-CM | POA: Diagnosis not present

## 2019-03-15 DIAGNOSIS — R7303 Prediabetes: Secondary | ICD-10-CM | POA: Diagnosis not present

## 2019-03-15 DIAGNOSIS — E7800 Pure hypercholesterolemia, unspecified: Secondary | ICD-10-CM | POA: Diagnosis not present

## 2019-03-15 DIAGNOSIS — I10 Essential (primary) hypertension: Secondary | ICD-10-CM | POA: Diagnosis not present

## 2019-03-15 DIAGNOSIS — R7301 Impaired fasting glucose: Secondary | ICD-10-CM | POA: Diagnosis not present

## 2019-07-10 DIAGNOSIS — R5383 Other fatigue: Secondary | ICD-10-CM | POA: Diagnosis not present

## 2019-07-10 DIAGNOSIS — R42 Dizziness and giddiness: Secondary | ICD-10-CM | POA: Diagnosis not present

## 2019-08-21 DIAGNOSIS — F99 Mental disorder, not otherwise specified: Secondary | ICD-10-CM | POA: Diagnosis not present

## 2019-08-21 DIAGNOSIS — M545 Low back pain: Secondary | ICD-10-CM | POA: Diagnosis not present

## 2019-08-21 DIAGNOSIS — M47816 Spondylosis without myelopathy or radiculopathy, lumbar region: Secondary | ICD-10-CM | POA: Diagnosis not present

## 2019-08-21 DIAGNOSIS — G250 Essential tremor: Secondary | ICD-10-CM | POA: Diagnosis not present

## 2019-09-20 DIAGNOSIS — R7303 Prediabetes: Secondary | ICD-10-CM | POA: Diagnosis not present

## 2019-09-20 DIAGNOSIS — I10 Essential (primary) hypertension: Secondary | ICD-10-CM | POA: Diagnosis not present

## 2019-09-22 DIAGNOSIS — Z79899 Other long term (current) drug therapy: Secondary | ICD-10-CM | POA: Diagnosis not present

## 2019-09-22 DIAGNOSIS — J449 Chronic obstructive pulmonary disease, unspecified: Secondary | ICD-10-CM | POA: Diagnosis not present

## 2019-09-22 DIAGNOSIS — R079 Chest pain, unspecified: Secondary | ICD-10-CM | POA: Diagnosis not present

## 2019-09-22 DIAGNOSIS — R7989 Other specified abnormal findings of blood chemistry: Secondary | ICD-10-CM | POA: Diagnosis not present

## 2019-09-22 DIAGNOSIS — R0789 Other chest pain: Secondary | ICD-10-CM | POA: Diagnosis not present

## 2019-09-22 DIAGNOSIS — E785 Hyperlipidemia, unspecified: Secondary | ICD-10-CM | POA: Diagnosis not present

## 2019-09-22 DIAGNOSIS — I10 Essential (primary) hypertension: Secondary | ICD-10-CM | POA: Diagnosis not present

## 2019-09-22 DIAGNOSIS — F17210 Nicotine dependence, cigarettes, uncomplicated: Secondary | ICD-10-CM | POA: Diagnosis not present

## 2019-09-23 DIAGNOSIS — R079 Chest pain, unspecified: Secondary | ICD-10-CM

## 2019-09-25 DIAGNOSIS — F330 Major depressive disorder, recurrent, mild: Secondary | ICD-10-CM | POA: Diagnosis not present

## 2019-09-25 DIAGNOSIS — I2510 Atherosclerotic heart disease of native coronary artery without angina pectoris: Secondary | ICD-10-CM | POA: Diagnosis not present

## 2019-09-25 DIAGNOSIS — I10 Essential (primary) hypertension: Secondary | ICD-10-CM | POA: Diagnosis not present

## 2019-09-25 DIAGNOSIS — E7800 Pure hypercholesterolemia, unspecified: Secondary | ICD-10-CM | POA: Diagnosis not present

## 2019-09-25 DIAGNOSIS — Z23 Encounter for immunization: Secondary | ICD-10-CM | POA: Diagnosis not present

## 2019-09-25 DIAGNOSIS — F17210 Nicotine dependence, cigarettes, uncomplicated: Secondary | ICD-10-CM | POA: Diagnosis not present

## 2019-09-25 DIAGNOSIS — F411 Generalized anxiety disorder: Secondary | ICD-10-CM | POA: Diagnosis not present

## 2019-09-25 DIAGNOSIS — R7303 Prediabetes: Secondary | ICD-10-CM | POA: Diagnosis not present

## 2019-11-24 DIAGNOSIS — Z1211 Encounter for screening for malignant neoplasm of colon: Secondary | ICD-10-CM | POA: Diagnosis not present

## 2019-11-29 DIAGNOSIS — R8281 Pyuria: Secondary | ICD-10-CM | POA: Diagnosis not present

## 2019-11-29 DIAGNOSIS — R5381 Other malaise: Secondary | ICD-10-CM | POA: Diagnosis not present

## 2019-11-29 DIAGNOSIS — Z20828 Contact with and (suspected) exposure to other viral communicable diseases: Secondary | ICD-10-CM | POA: Diagnosis not present

## 2019-11-29 DIAGNOSIS — R946 Abnormal results of thyroid function studies: Secondary | ICD-10-CM | POA: Diagnosis not present

## 2019-12-04 DIAGNOSIS — E0590 Thyrotoxicosis, unspecified without thyrotoxic crisis or storm: Secondary | ICD-10-CM | POA: Diagnosis not present

## 2019-12-04 DIAGNOSIS — E559 Vitamin D deficiency, unspecified: Secondary | ICD-10-CM | POA: Diagnosis not present

## 2020-01-02 DIAGNOSIS — F250 Schizoaffective disorder, bipolar type: Secondary | ICD-10-CM | POA: Diagnosis not present

## 2020-01-02 DIAGNOSIS — F39 Unspecified mood [affective] disorder: Secondary | ICD-10-CM | POA: Diagnosis not present

## 2022-01-04 ENCOUNTER — Observation Stay (HOSPITAL_COMMUNITY)
Admission: EM | Admit: 2022-01-04 | Discharge: 2022-01-06 | Disposition: A | Discharge status: Home / Self Care | Payer: Medicare Other | Attending: Family Medicine | Admitting: Family Medicine

## 2022-01-04 ENCOUNTER — Other Ambulatory Visit: Payer: Self-pay

## 2022-01-04 ENCOUNTER — Encounter (HOSPITAL_COMMUNITY): Payer: Self-pay

## 2022-01-04 ENCOUNTER — Emergency Department (HOSPITAL_COMMUNITY): Payer: Medicare Other

## 2022-01-04 DIAGNOSIS — R42 Dizziness and giddiness: Secondary | ICD-10-CM

## 2022-01-04 DIAGNOSIS — R41841 Cognitive communication deficit: Secondary | ICD-10-CM | POA: Insufficient documentation

## 2022-01-04 DIAGNOSIS — E119 Type 2 diabetes mellitus without complications: Secondary | ICD-10-CM | POA: Insufficient documentation

## 2022-01-04 DIAGNOSIS — E1169 Type 2 diabetes mellitus with other specified complication: Secondary | ICD-10-CM

## 2022-01-04 DIAGNOSIS — J45909 Unspecified asthma, uncomplicated: Secondary | ICD-10-CM | POA: Insufficient documentation

## 2022-01-04 DIAGNOSIS — F17210 Nicotine dependence, cigarettes, uncomplicated: Secondary | ICD-10-CM | POA: Insufficient documentation

## 2022-01-04 DIAGNOSIS — Z720 Tobacco use: Secondary | ICD-10-CM

## 2022-01-04 DIAGNOSIS — E669 Obesity, unspecified: Secondary | ICD-10-CM

## 2022-01-04 DIAGNOSIS — G459 Transient cerebral ischemic attack, unspecified: Principal | ICD-10-CM | POA: Diagnosis present

## 2022-01-04 DIAGNOSIS — I10 Essential (primary) hypertension: Secondary | ICD-10-CM

## 2022-01-04 DIAGNOSIS — Z20822 Contact with and (suspected) exposure to covid-19: Secondary | ICD-10-CM | POA: Insufficient documentation

## 2022-01-04 DIAGNOSIS — I639 Cerebral infarction, unspecified: Secondary | ICD-10-CM

## 2022-01-04 DIAGNOSIS — I2510 Atherosclerotic heart disease of native coronary artery without angina pectoris: Secondary | ICD-10-CM | POA: Insufficient documentation

## 2022-01-04 HISTORY — DX: Atherosclerotic heart disease of native coronary artery without angina pectoris: I25.10

## 2022-01-04 HISTORY — DX: Essential (primary) hypertension: I10

## 2022-01-04 HISTORY — DX: Unspecified asthma, uncomplicated: J45.909

## 2022-01-04 HISTORY — DX: Type 2 diabetes mellitus without complications: E11.9

## 2022-01-04 LAB — DIFFERENTIAL
Abs Immature Granulocytes: 0.04 10*3/uL (ref 0.00–0.07)
Basophils Absolute: 0.1 10*3/uL (ref 0.0–0.1)
Basophils Relative: 1 %
Eosinophils Absolute: 0.2 10*3/uL (ref 0.0–0.5)
Eosinophils Relative: 3 %
Immature Granulocytes: 1 %
Lymphocytes Relative: 22 %
Lymphs Abs: 1.5 10*3/uL (ref 0.7–4.0)
Monocytes Absolute: 0.6 10*3/uL (ref 0.1–1.0)
Monocytes Relative: 9 %
Neutro Abs: 4.3 10*3/uL (ref 1.7–7.7)
Neutrophils Relative %: 64 %

## 2022-01-04 LAB — COMPREHENSIVE METABOLIC PANEL
ALT: 23 U/L (ref 0–44)
AST: 23 U/L (ref 15–41)
Albumin: 3.7 g/dL (ref 3.5–5.0)
Alkaline Phosphatase: 58 U/L (ref 38–126)
Anion gap: 8 (ref 5–15)
BUN: 13 mg/dL (ref 8–23)
CO2: 27 mmol/L (ref 22–32)
Calcium: 9.2 mg/dL (ref 8.9–10.3)
Chloride: 104 mmol/L (ref 98–111)
Creatinine, Ser: 0.96 mg/dL (ref 0.44–1.00)
GFR, Estimated: >60 mL/min (ref >60)
Glucose, Bld: 179 mg/dL — ABNORMAL HIGH (ref 70–99)
Potassium: 3.5 mmol/L (ref 3.5–5.1)
Sodium: 139 mmol/L (ref 135–145)
Total Bilirubin: 0.7 mg/dL (ref 0.3–1.2)
Total Protein: 6.9 g/dL (ref 6.5–8.1)

## 2022-01-04 LAB — CBC
HCT: 45 % (ref 36.0–46.0)
Hemoglobin: 14.9 g/dL (ref 12.0–15.0)
MCH: 28 pg (ref 26.0–34.0)
MCHC: 33.1 g/dL (ref 30.0–36.0)
MCV: 84.4 fL (ref 80.0–100.0)
Platelets: 256 10*3/uL (ref 150–400)
RBC: 5.33 MIL/uL — ABNORMAL HIGH (ref 3.87–5.11)
RDW: 15.9 % — ABNORMAL HIGH (ref 11.5–15.5)
WBC: 6.7 10*3/uL (ref 4.0–10.5)
nRBC: 0 % (ref 0.0–0.2)

## 2022-01-04 LAB — RESP PANEL BY RT-PCR (FLU A&B, COVID) ARPGX2
Influenza A by PCR: NEGATIVE
Influenza B by PCR: NEGATIVE
SARS Coronavirus 2 by RT PCR: NEGATIVE

## 2022-01-04 LAB — I-STAT CHEM 8, ED
BUN: 17 mg/dL (ref 8–23)
Calcium, Ion: 1.18 mmol/L (ref 1.15–1.40)
Chloride: 102 mmol/L (ref 98–111)
Creatinine, Ser: 0.8 mg/dL (ref 0.44–1.00)
Glucose, Bld: 176 mg/dL — ABNORMAL HIGH (ref 70–99)
HCT: 44 % (ref 36.0–46.0)
Hemoglobin: 15 g/dL (ref 12.0–15.0)
Potassium: 3.5 mmol/L (ref 3.5–5.1)
Sodium: 141 mmol/L (ref 135–145)
TCO2: 29 mmol/L (ref 22–32)

## 2022-01-04 LAB — APTT: aPTT: 27 seconds (ref 24–36)

## 2022-01-04 LAB — PROTIME-INR
INR: 0.9 (ref 0.8–1.2)
Prothrombin Time: 12.4 seconds (ref 11.4–15.2)

## 2022-01-04 LAB — CBG MONITORING, ED: Glucose-Capillary: 203 mg/dL — ABNORMAL HIGH (ref 70–99)

## 2022-01-04 IMAGING — CT CT HEAD W/O CM
4 series · 17 of 47 positions shown, 19 images · non-contrast
Comparison: None.

CLINICAL DATA: slurred speech



[Series 3: head wo · axial · 0.40mm/px · z∈[+476,+601]mm · 7 of 35 slices shown, 9 images]
[im 5/35  brain]
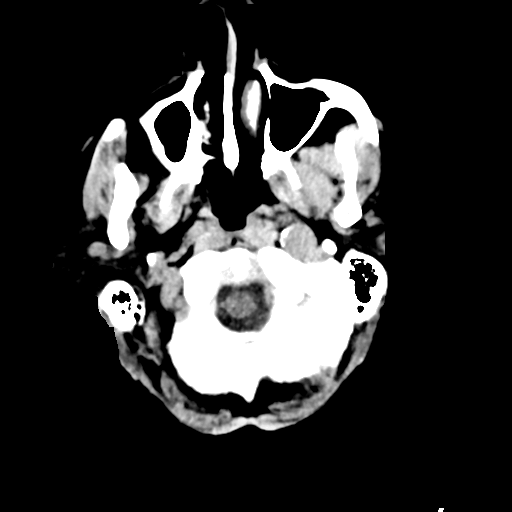
[im 5/35  bone]
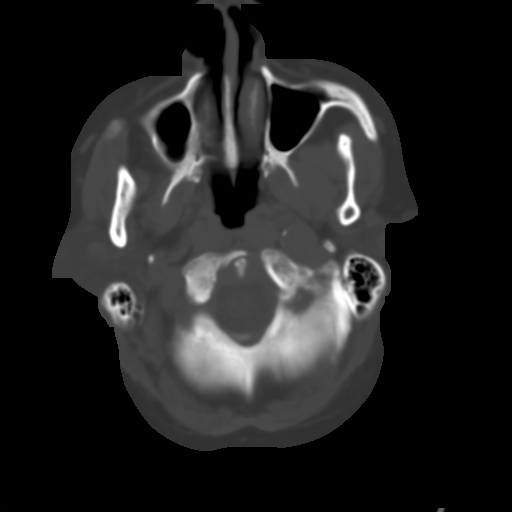
[im 9/35  brain]
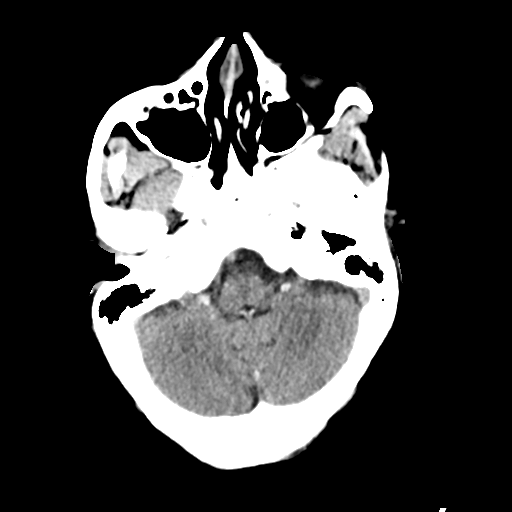
[im 13/35  brain]
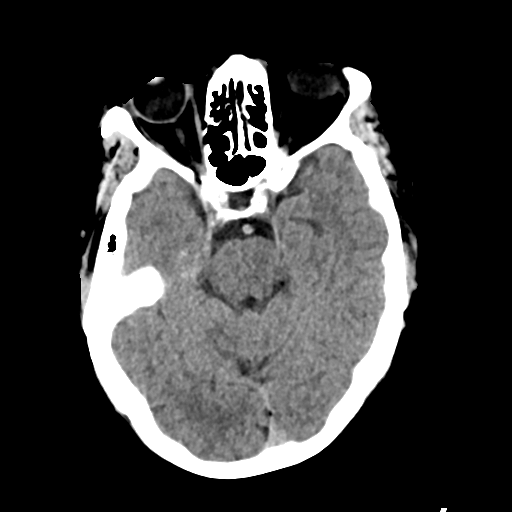
[im 18/35  brain]
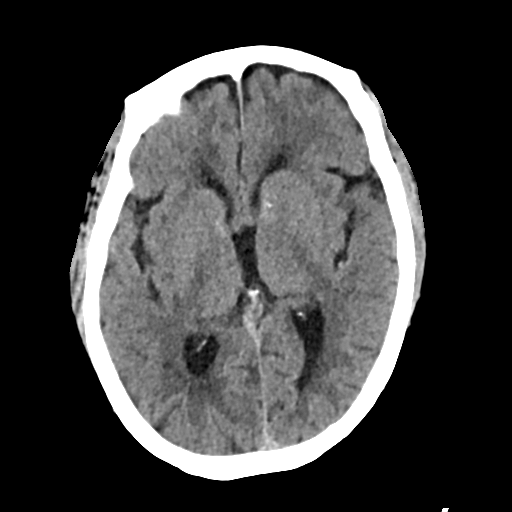
[im 22/35  brain]
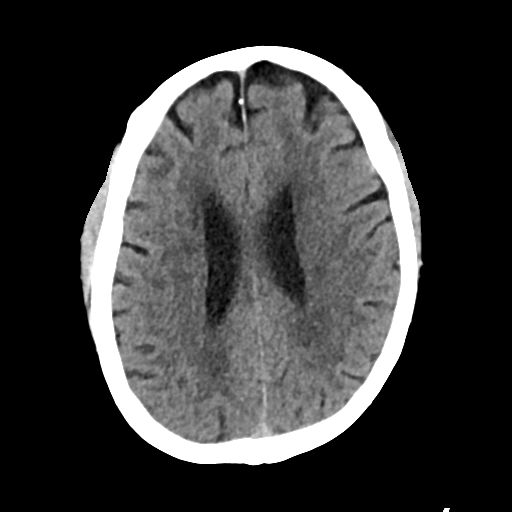
[im 22/35  bone]
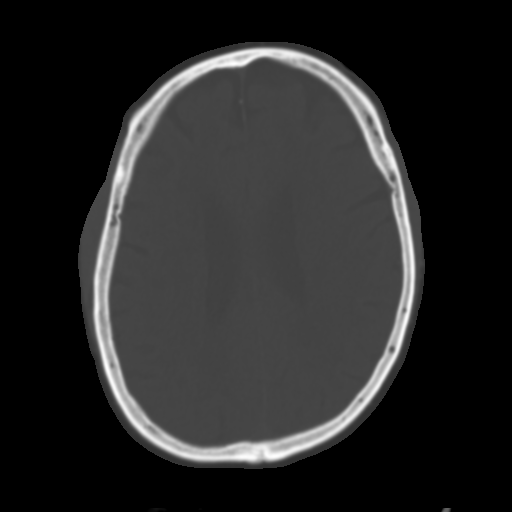
[im 26/35  brain]
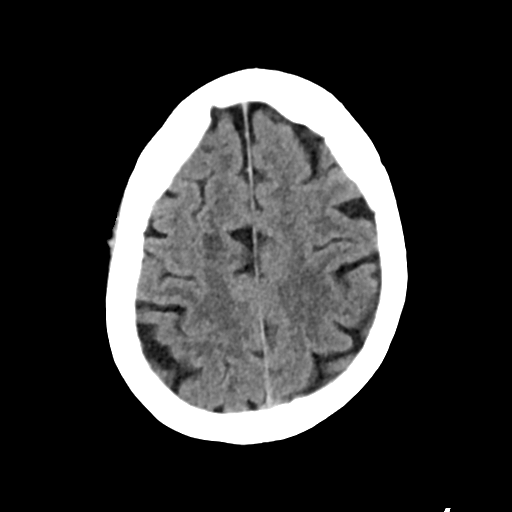
[im 30/35  brain]
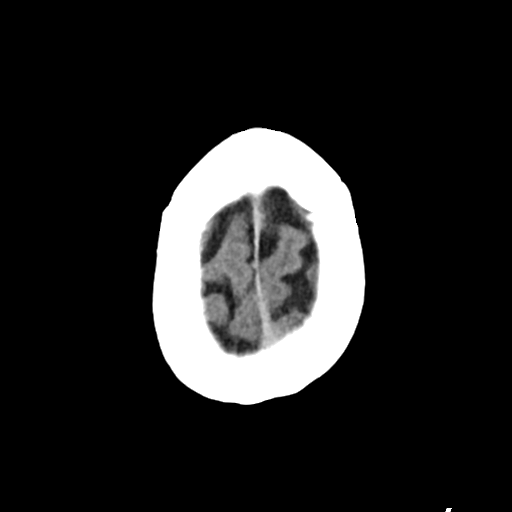

[Series 4: head bone · axial · 0.40mm/px · z∈[+472,+532]mm · 4 of 86 slices shown]
[im 9/86  bone]
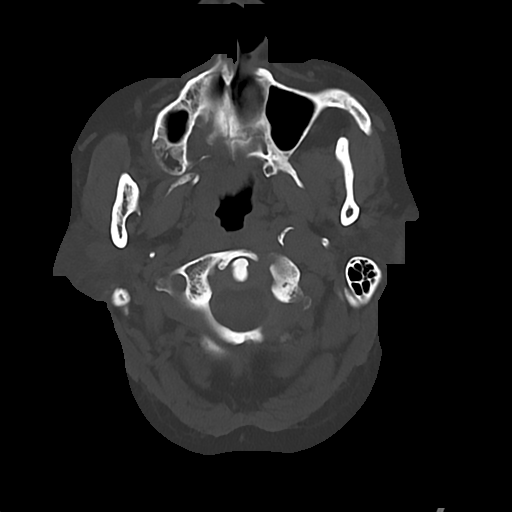
[im 18/86  bone]
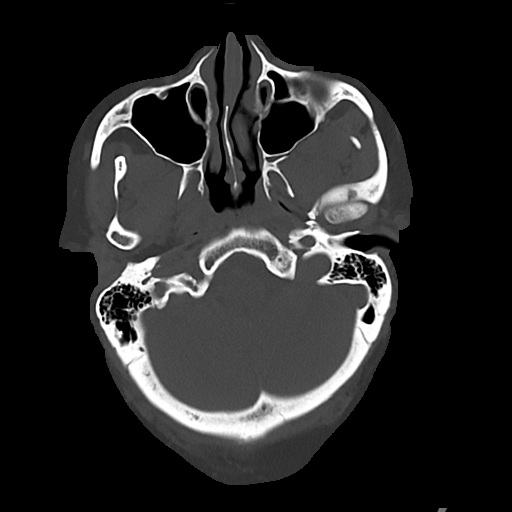
[im 26/86  bone]
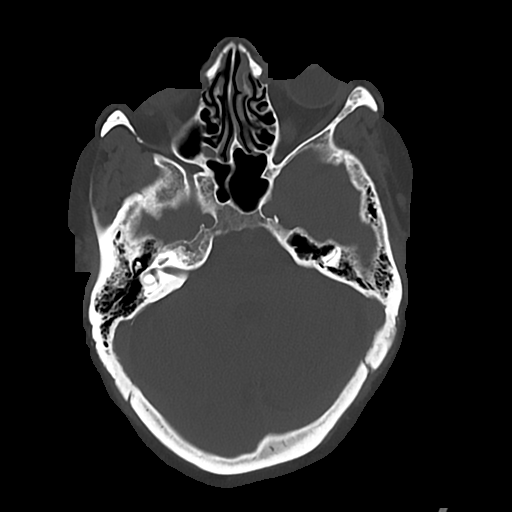
[im 39/86  bone]
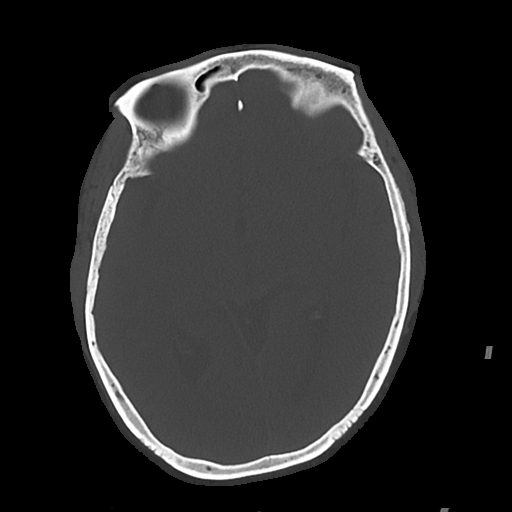

[Series 5: cor soft · coronal · 0.33mm/px · 3 of 68 slices shown]
[im 23/68  brain]
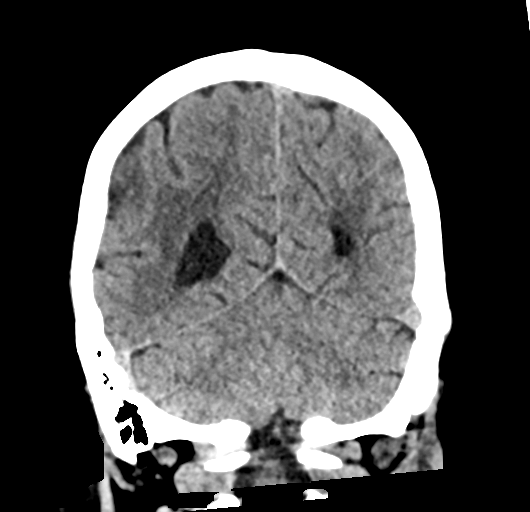
[im 30/68  brain]
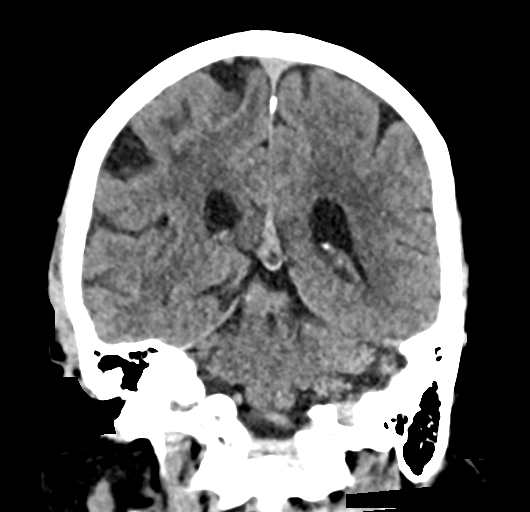
[im 38/68  brain]
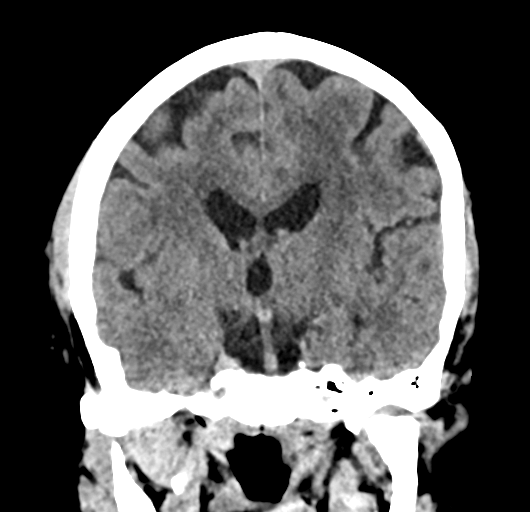

[Series 6: sag soft · sagittal · 0.35mm/px · 3 of 58 slices shown]
[im 20/58  brain]
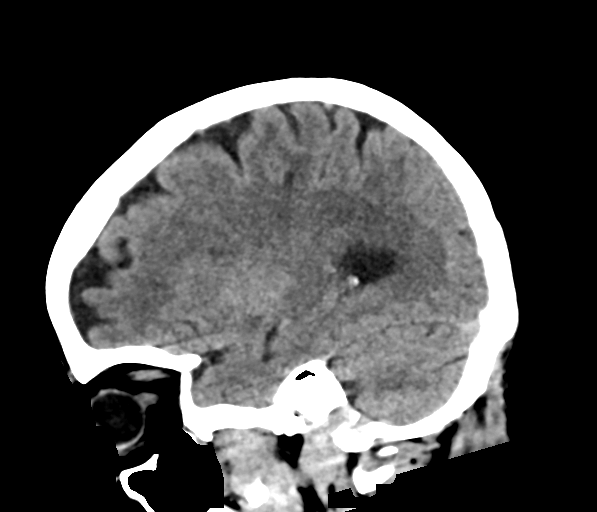
[im 29/58  brain]
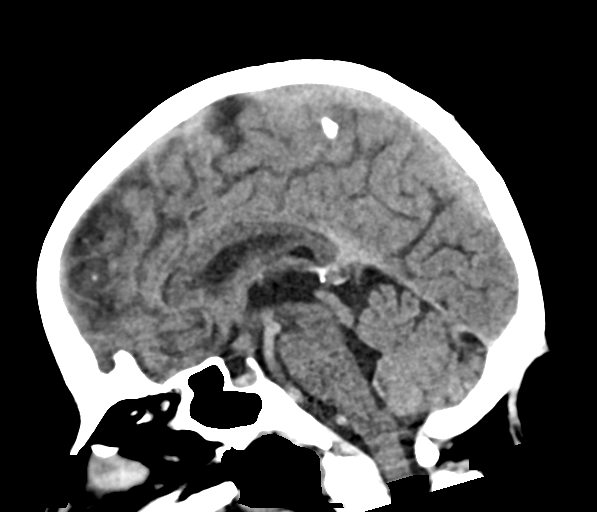
[im 39/58  brain]
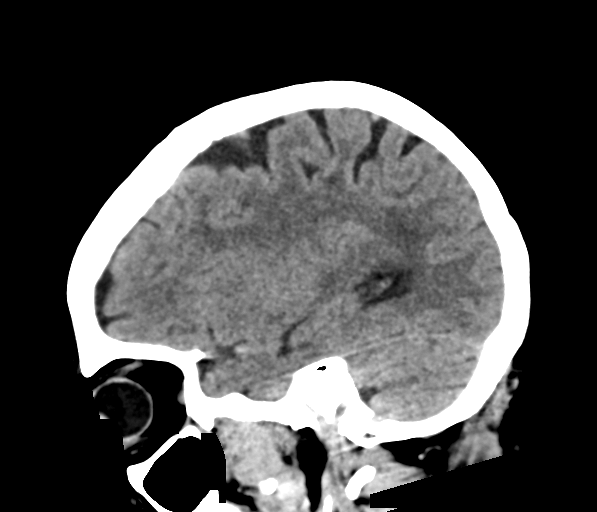

[17 of 47 positions shown; findings below may reference images not displayed]

BRAIN:
BRAIN
Cerebral ventricle sizes are concordant with the degree of cerebral
volume loss. Patchy and confluent areas of decreased attenuation are
noted throughout the deep and periventricular white matter of the
cerebral hemispheres bilaterally, compatible with chronic
microvascular ischemic disease.

No evidence of large-territorial acute infarction. No parenchymal
hemorrhage. No mass lesion. No extra-axial collection.

No mass effect or midline shift. No hydrocephalus. Basilar cisterns
are patent.

Vascular: No hyperdense vessel.

Skull: No acute fracture or focal lesion.

Sinuses/Orbits: Paranasal sinuses and mastoid air cells are clear.
The orbits are unremarkable.

Other: None.
IMPRESSION: No acute intracranial abnormality in a patient with chronic
microvascular ischemic changes diffuse atrophy.

## 2022-01-04 MED ORDER — SODIUM CHLORIDE 0.9% FLUSH: 3.0000 mL | Freq: Once | INTRAVENOUS | Status: DC

## 2022-01-04 MED ORDER — ASPIRIN 325 MG PO TABS
325.0000 mg | ORAL_TABLET | Freq: Every day | ORAL | Status: DC
  Administered 2022-01-04: 325 mg via ORAL
  Filled 2022-01-04: qty 1

## 2022-01-04 NOTE — ED Triage Notes (Signed)
Pt presents to the ED from home via [facility] with complaints of dizziness after watching wheel of fortune. Pt states she got up and started walking, began feeling dizzy. EMS started PIV, stroke screen negative. Family stated pt had slurred speech, currently speech thick but not slurred. Pt denies new medications. Grips and push/pulls equal and weak. Pt states she has felt weak all over all day.

## 2022-01-04 NOTE — ED Notes (Signed)
Patient transported to CT 

## 2022-01-04 NOTE — ED Provider Triage Note (Addendum)
Emergency Medicine Provider Triage Evaluation Note  Brittney Larsen , a 71 y.o. female  was evaluated in triage.  Pt complains of dizziness characterizes a lightheaded sensation and weakness to the lower extremities that occurred approximately 40 minutes ago.  Patient also having trouble with her words.  Patient just does not feel well.  No chest pain or shortness of breath.  Review of Systems  Positive:  Negative: See above   Physical Exam  BP (!) 173/94    Pulse 70    Temp 98.6 F (37 C) (Oral)    Resp 20    Ht 5' (1.524 m)    Wt 68 kg    SpO2 94%    BMI 29.29 kg/m  Gen:   Awake, no distress   Resp:  Normal effort  MSK:   Moves extremities without difficulty  Other:  Cranial nerves II through XII are intact apart from cranial nerve XI with questionable slurred speech.  Slight decreased grip strength in the right in comparison to the left.  The rest of her neurological exam is completely normal.  No pronator drift.  Medical Decision Making  Medically screening exam initiated at 9:57 PM.  Appropriate orders placed.  KENDELL GAMMON was informed that the remainder of the evaluation will be completed by another provider, this initial triage assessment does not replace that evaluation, and the importance of remaining in the ED until their evaluation is complete.  I spoke with Dr. Theda Sers neurology who came and evaluated the patient at the bedside and recommended stroke work-up but do not activate code stroke at this time and give 325 of aspirin.   Hendricks Limes, PA-C 01/04/22 2159    Myna Bright M, PA-C 01/04/22 2200

## 2022-01-05 ENCOUNTER — Encounter (HOSPITAL_COMMUNITY): Payer: Self-pay | Admitting: Family Medicine

## 2022-01-05 ENCOUNTER — Observation Stay (HOSPITAL_COMMUNITY): Payer: Medicare Other

## 2022-01-05 ENCOUNTER — Observation Stay (HOSPITAL_BASED_OUTPATIENT_CLINIC_OR_DEPARTMENT_OTHER): Payer: Medicare Other

## 2022-01-05 DIAGNOSIS — I6389 Other cerebral infarction: Secondary | ICD-10-CM | POA: Diagnosis not present

## 2022-01-05 DIAGNOSIS — Z720 Tobacco use: Secondary | ICD-10-CM

## 2022-01-05 DIAGNOSIS — G459 Transient cerebral ischemic attack, unspecified: Secondary | ICD-10-CM

## 2022-01-05 DIAGNOSIS — I10 Essential (primary) hypertension: Secondary | ICD-10-CM

## 2022-01-05 DIAGNOSIS — E1169 Type 2 diabetes mellitus with other specified complication: Secondary | ICD-10-CM

## 2022-01-05 DIAGNOSIS — E669 Obesity, unspecified: Secondary | ICD-10-CM

## 2022-01-05 LAB — URINALYSIS, ROUTINE W REFLEX MICROSCOPIC
Glucose, UA: NEGATIVE mg/dL
Hgb urine dipstick: NEGATIVE
Ketones, ur: NEGATIVE mg/dL
Leukocytes,Ua: NEGATIVE
Nitrite: NEGATIVE
Protein, ur: 30 mg/dL — AB
Specific Gravity, Urine: >1.03 — ABNORMAL HIGH (ref 1.005–1.030)
pH: 5.5 (ref 5.0–8.0)

## 2022-01-05 LAB — CBG MONITORING, ED
Glucose-Capillary: 104 mg/dL — ABNORMAL HIGH (ref 70–99)
Glucose-Capillary: 87 mg/dL (ref 70–99)

## 2022-01-05 LAB — URINALYSIS, MICROSCOPIC (REFLEX)

## 2022-01-05 LAB — ECHOCARDIOGRAM COMPLETE
Area-P 1/2: 4.86 cm2
Height: 60 in
S' Lateral: 3.7 cm
Weight: 2400 oz

## 2022-01-05 LAB — GLUCOSE, CAPILLARY: Glucose-Capillary: 102 mg/dL — ABNORMAL HIGH (ref 70–99)

## 2022-01-05 IMAGING — MR MR HEAD W/O CM
12 of 13 series · 44 of 48 positions shown · non-contrast
Comparison: Head CT yesterday.  Cervical spine MRI [DATE].

CLINICAL DATA: 70-year-old female with neurologic deficit. Slurred
speech. TIA.

EXAM:
MRI HEAD WITHOUT CONTRAST
TECHNIQUE: Multiplanar, multiecho pulse sequences of the brain and surrounding
structures were obtained without intravenous contrast.

[Series 5: DWI · axial · 3.0mm · 0.88mm/px · z∈[-107,+52]mm · 8 of 110 slices shown (1 of 4)]
[im 1/110]
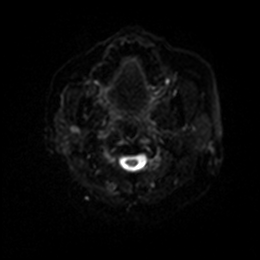
[im 16/110]
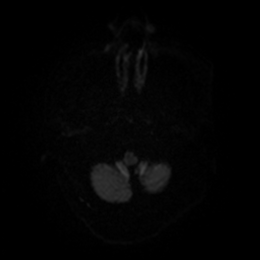
[im 32/110]
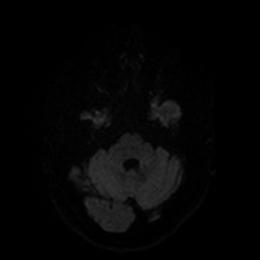
[im 47/110]
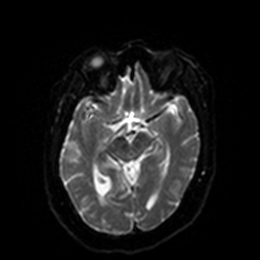
[im 63/110]
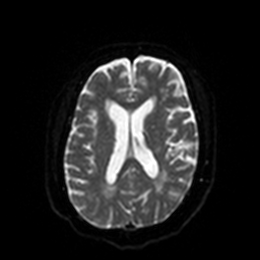
[im 78/110]
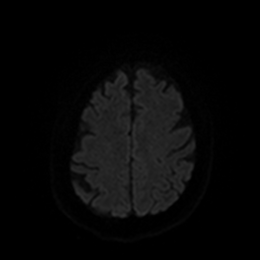
[im 94/110]
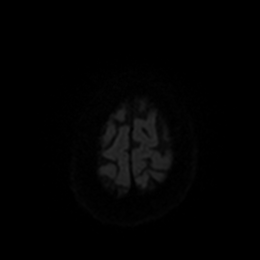
[im 110/110]
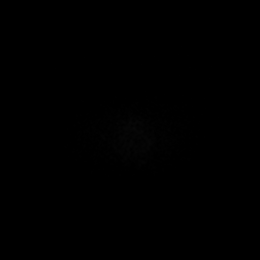

[Series 6: DWI · axial · 3.0mm · 0.88mm/px · z∈[-107,+52]mm · 4 of 55 slices shown (2 of 4)]
[im 1/55]
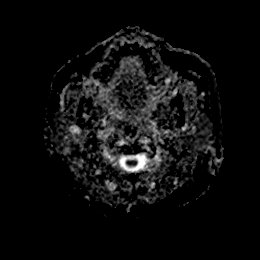
[im 19/55]
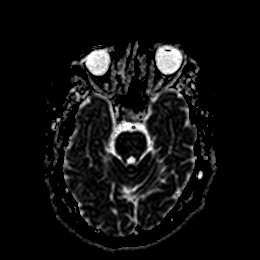
[im 37/55]
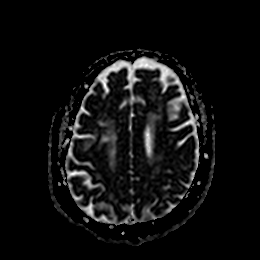
[im 55/55]
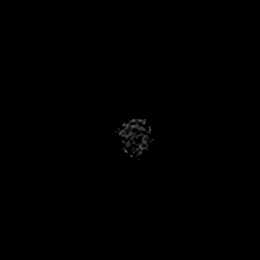

[Series 7: DWI · coronal · 4.0mm · 0.88mm/px · 5 of 74 slices shown (3 of 4)]
[im 1/74]
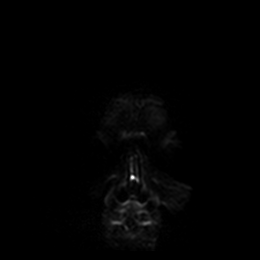
[im 19/74]
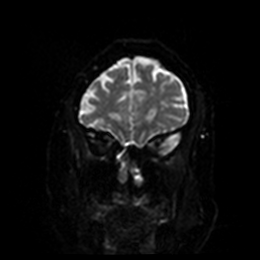
[im 37/74]
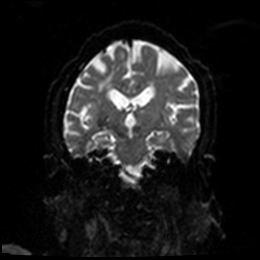
[im 55/74]
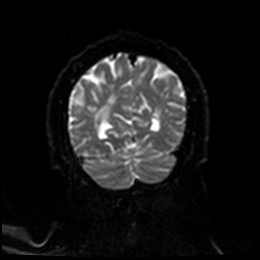
[im 74/74]
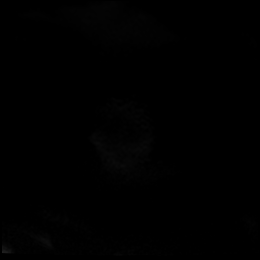

[Series 8: DWI · coronal · 4.0mm · 0.88mm/px · 3 of 37 slices shown (4 of 4)]
[im 1/37]
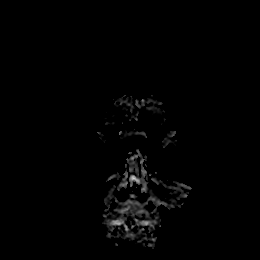
[im 19/37]
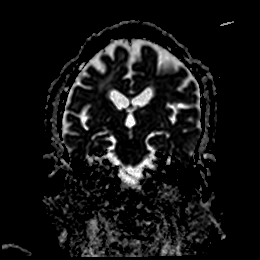
[im 37/37]
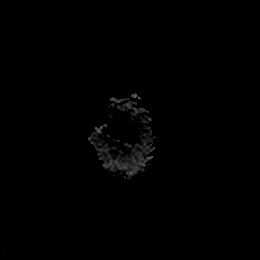

[Series 9: T1 · sagittal · 5.0mm · 0.75mm/px · 2 of 28 slices shown]
[im 1/28]
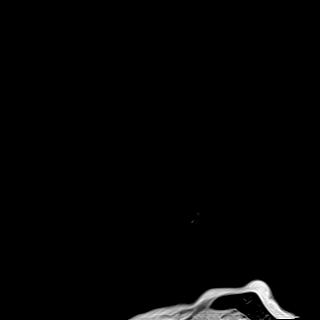
[im 28/28]
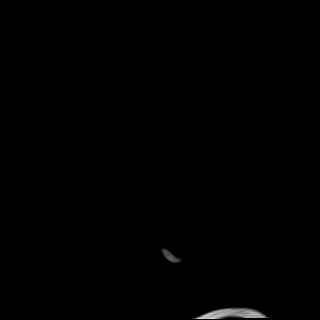

[Series 10: T2 · axial · 5.0mm · 0.72mm/px · z∈[-107,+52]mm · 2 of 28 slices shown (1 of 2)]
[im 1/28]
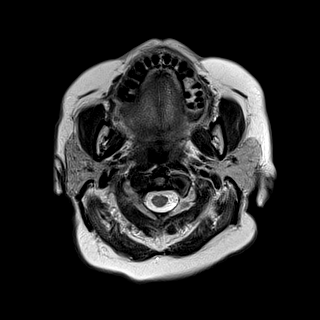
[im 28/28]
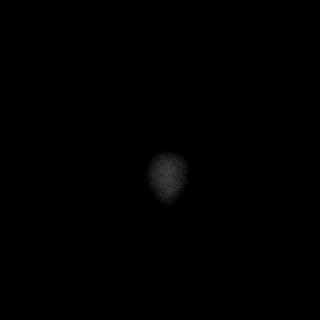

[Series 11: FLAIR · axial · 5.0mm · 0.45mm/px · z∈[-104,+54]mm · 2 of 28 slices shown]
[im 1/28]
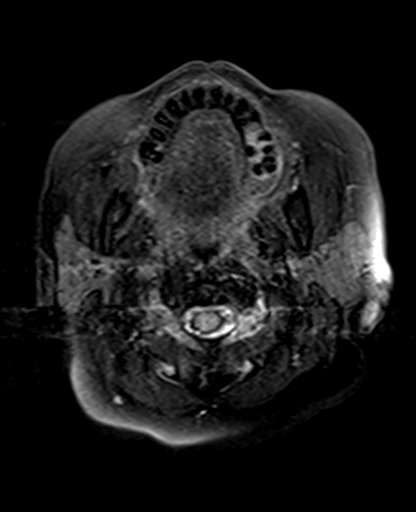
[im 28/28]
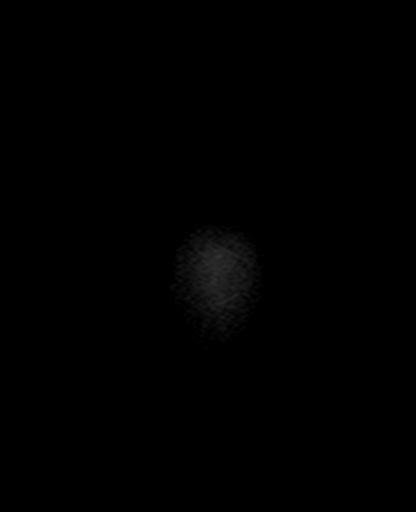

[Series 12: mag_images · axial · 3.0mm · 0.90mm/px · z∈[-106,+56]mm · 4 of 56 slices shown]
[im 1/56]
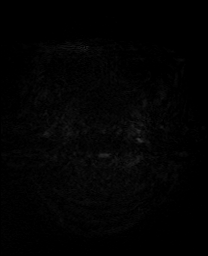
[im 19/56]
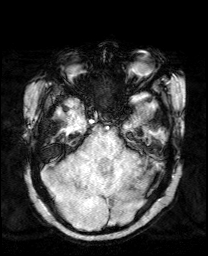
[im 37/56]
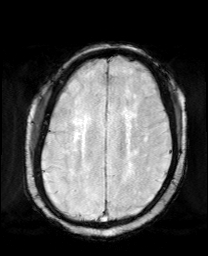
[im 56/56]
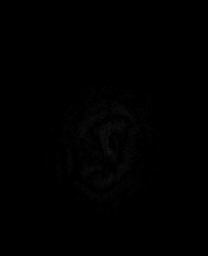

[Series 13: pha_images · axial · 3.0mm · 0.90mm/px · z∈[-103,+56]mm · 4 of 54 slices shown]
[im 1/54]
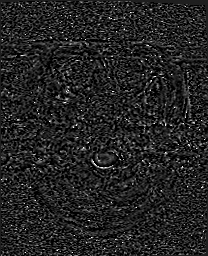
[im 18/54]
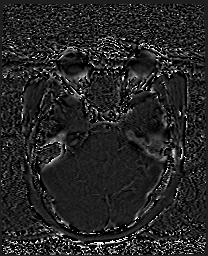
[im 36/54]
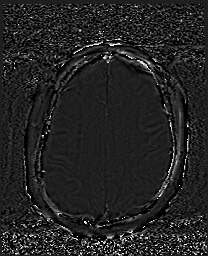
[im 54/54]
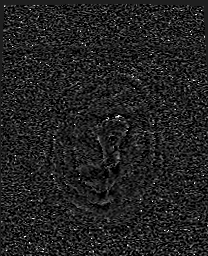

[Series 14: swi_images · axial · 3.0mm · 0.90mm/px · z∈[-106,+56]mm · 4 of 56 slices shown]
[im 1/56]
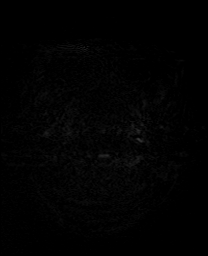
[im 19/56]
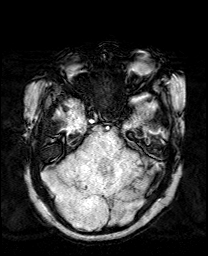
[im 37/56]
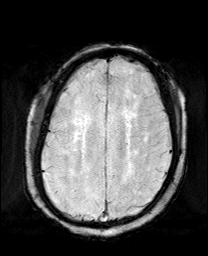
[im 56/56]
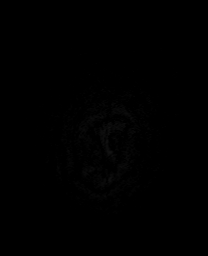

[Series 15: mip_images(sw) · axial · 24.0mm · 0.90mm/px · z∈[-95,+45]mm · 4 of 49 slices shown]
[im 1/49]
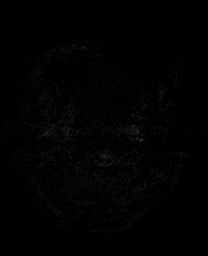
[im 17/49]
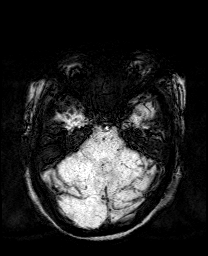
[im 33/49]
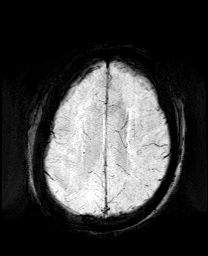
[im 49/49]
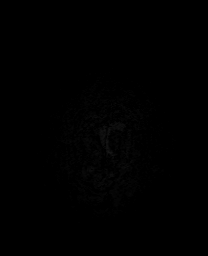

[Series 17: T2 · coronal · 5.0mm · 0.34mm/px · 2 of 31 slices shown (2 of 2)]
[im 1/31]
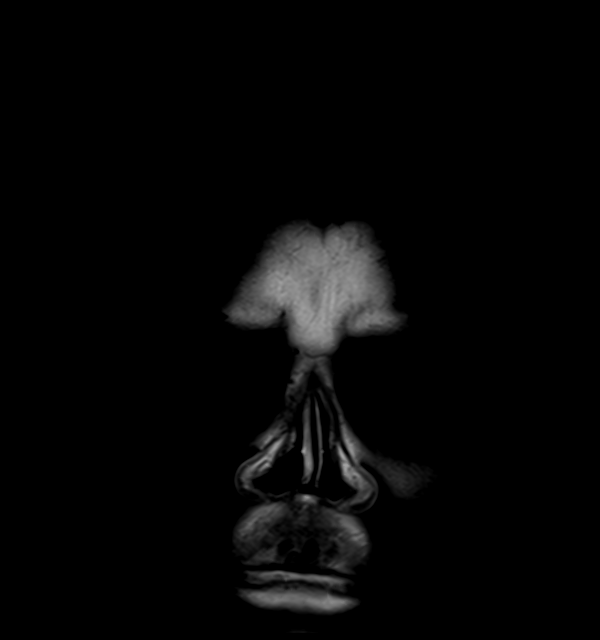
[im 31/31]
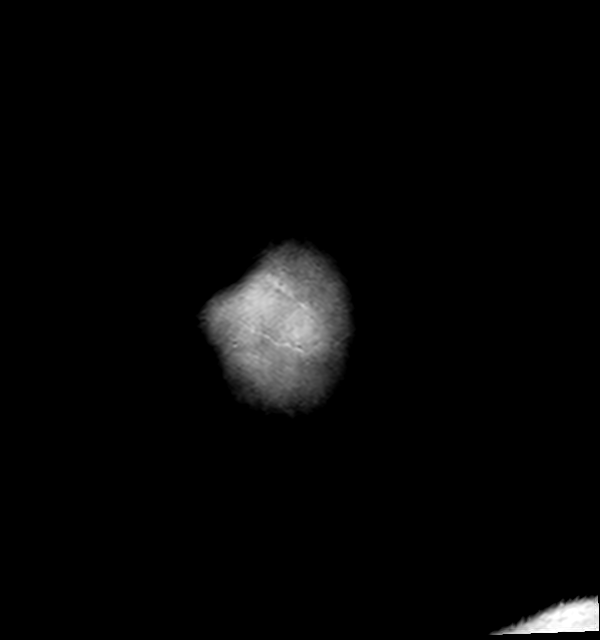

[44 of 48 positions shown; findings below may reference images not displayed]

FINDINGS: Brain: Subtle abnormal diffusion in the right cerebellum SCA
territory (series 5, image 73) with 2 more conspicuous small foci of
restricted diffusion in the posterosuperior cerebellum on that side
(series 5, image 69). Faint if any associated T2 and FLAIR
hyperintensity. No hemorrhage or mass effect.

Small contralateral left cerebellar chronic lacunar infarct on
series 10, image 8. No other diffusion restriction. Patchy and
confluent bilateral abnormal cerebral white matter T2 and FLAIR
hyperintensity which in the bilateral corona radiata and centrum
semiovale most resembles chronic white matter lacunar infarcts.

No cerebral cortical encephalomalacia or chronic cerebral blood
products identified. No midline shift, mass effect, evidence of mass
lesion, ventriculomegaly, extra-axial collection or acute
intracranial hemorrhage. Cervicomedullary junction and pituitary are
within normal limits. Deep gray nuclei and brainstem appear spared.

Vascular: Major intracranial vascular flow voids are preserved.

Skull and upper cervical spine: Negative for age visible cervical
spine. Visualized bone marrow signal is within normal limits.

Sinuses/Orbits: Negative orbits. Paranasal Visualized paranasal
sinuses and mastoids are stable and well aerated.

Other: Grossly normal visible internal auditory structures. Negative
visible scalp and face.
IMPRESSION: 1. Subtle Acute Infarct of the right cerebellum SCA territory. No
associated hemorrhage or mass effect.

2. Underlying chronic small vessel disease most pronounced in the
bilateral cerebral white matter, also with mild left cerebellar
involvement.

## 2022-01-05 IMAGING — MR MR MRA NECK WO/W CM
8 of 12 series · 24 of 48 positions shown · IV contrast (gadavist)
Comparison: None.

CLINICAL DATA: Acute neurologic deficit launch template MRA head
MRA neck both

EXAM:
MRA NECK WITHOUT AND WITH CONTRAST
MRA HEAD WITHOUT CONTRAST
TECHNIQUE: Multiplanar and multiecho pulse sequences of the neck were obtained
without and with intravenous contrast. Angiographic images of the
neck were obtained using MRA technique without and with intravenous
contrast; Angiographic images of the Circle of Willis were obtained
using MRA technique without intravenous contrast.
CONTRAST:  7mL GADAVIST GADOBUTROL 1 MMOL/ML IV SOLN

[Series 3: (id) mt fs · axial · 1.4mm · 0.43mm/px · z∈[-26,+73]mm · 7 of 144 slices shown]
[im 1/144]
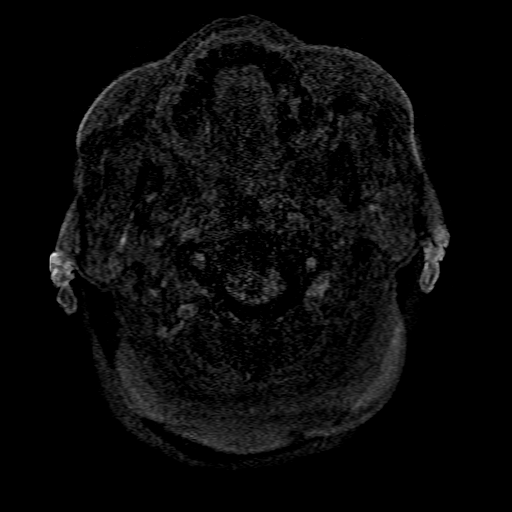
[im 24/144]
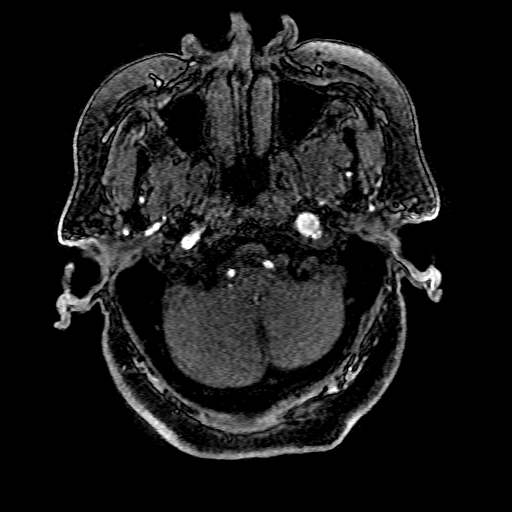
[im 48/144]
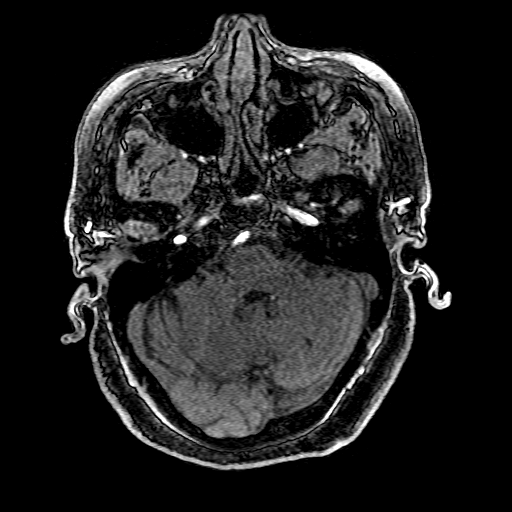
[im 72/144]
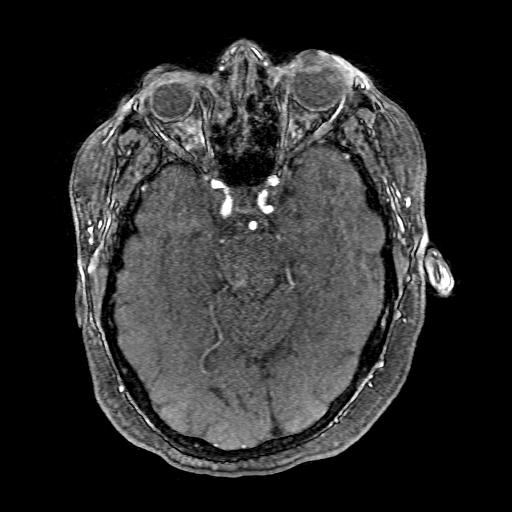
[im 96/144]
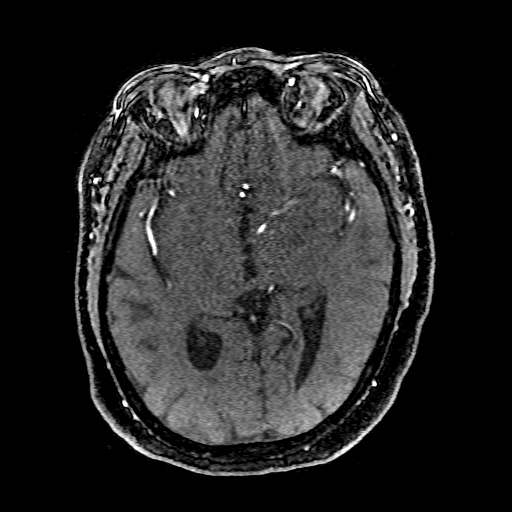
[im 120/144]
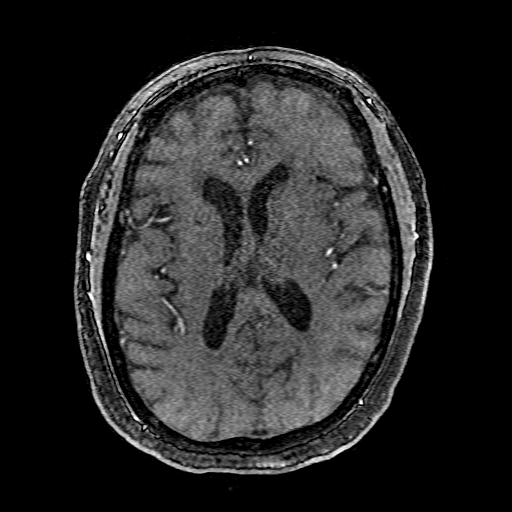
[im 144/144]
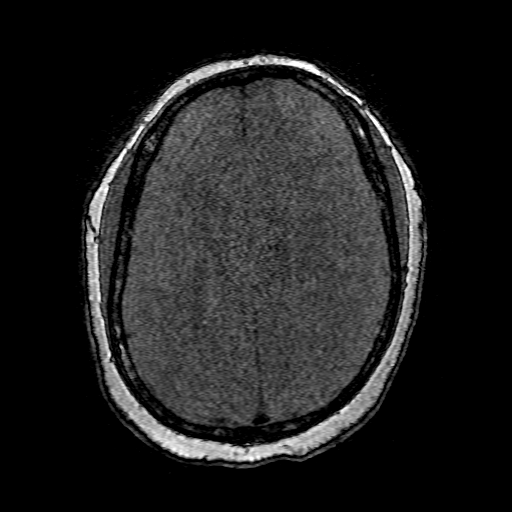

[Series 6: ax (id) fspgr · axial · 2.2mm · 0.86mm/px · z∈[-162,-6]mm · 6 of 113 slices shown]
[im 1/113]
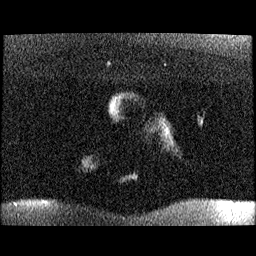
[im 23/113]
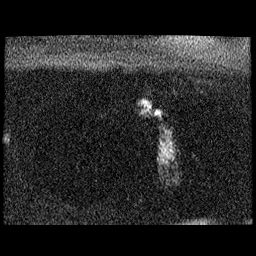
[im 45/113]
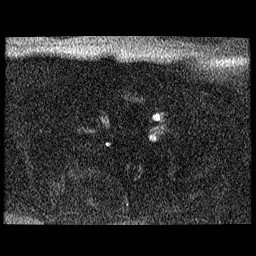
[im 68/113]
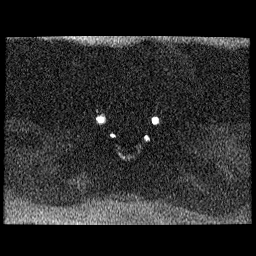
[im 90/113]
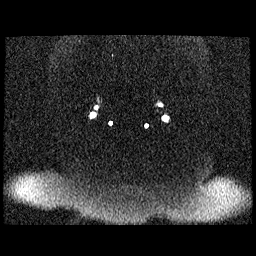
[im 113/113]
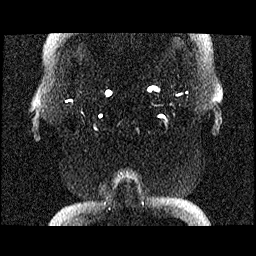

[Series 304: cow tumble · oblique · 1.4mm · 0.27mm/px · 1 of 3 slices shown]
[im 1/3]
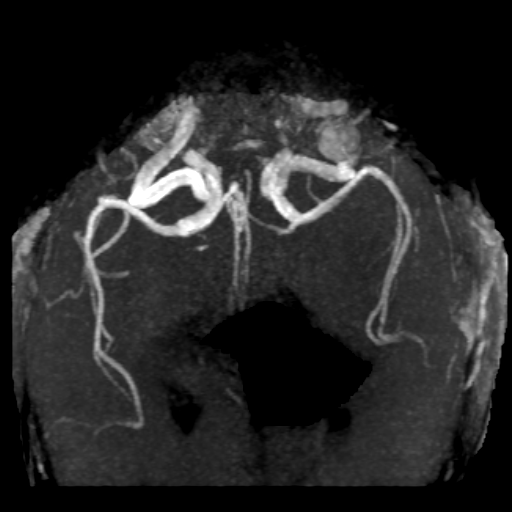

[Series 601: pjn:ax (id) fspgr · sagittal · 2.2mm · 0.86mm/px · 1 of 18 slices shown]
[im 1/18]
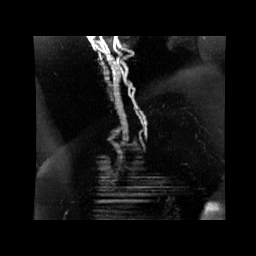

[Series 602: MRA · coronal · 2.2mm · 0.27mm/px · 1 of 19 slices shown (1 of 3)]
[im 1/19]
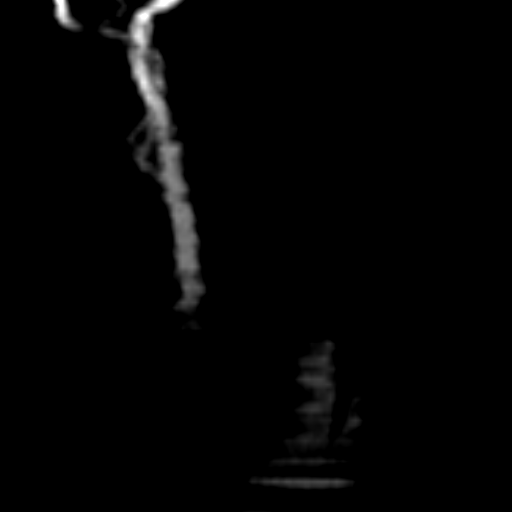

[Series 603: MRA · coronal · 2.2mm · 0.27mm/px · 1 of 16 slices shown (2 of 3)]
[im 1/16]
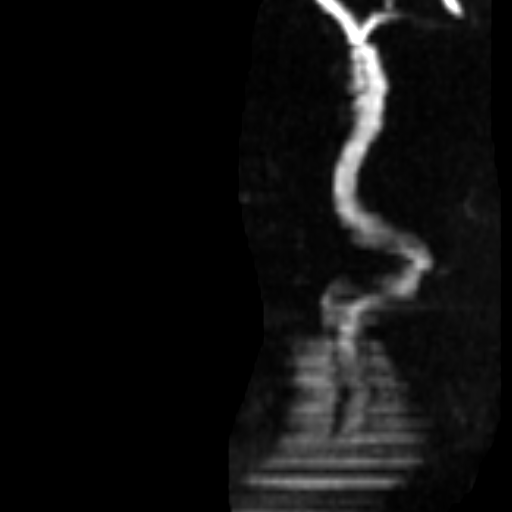

[Series 604: MRA · coronal · 2.2mm · 0.27mm/px · 1 of 7 slices shown (3 of 3)]
[im 1/7]
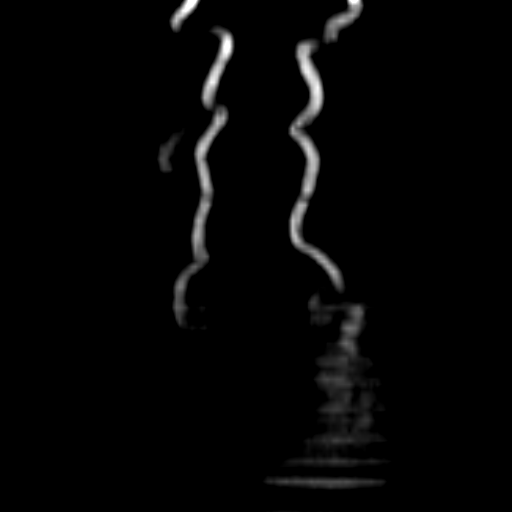

[Series 700: cor cemra ft · coronal · 1.4mm · 0.59mm/px · 6 of 108 slices shown]
[im 1/108]
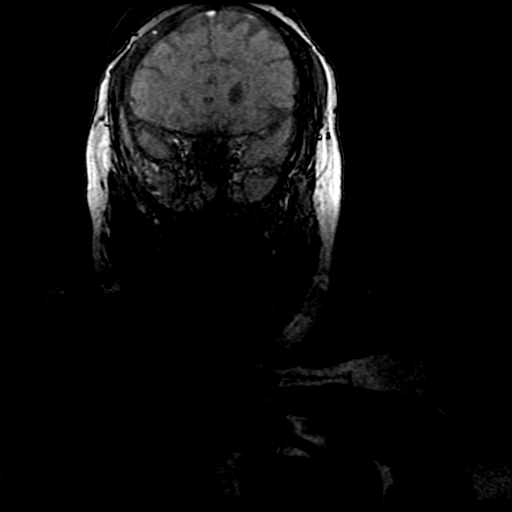
[im 22/108]
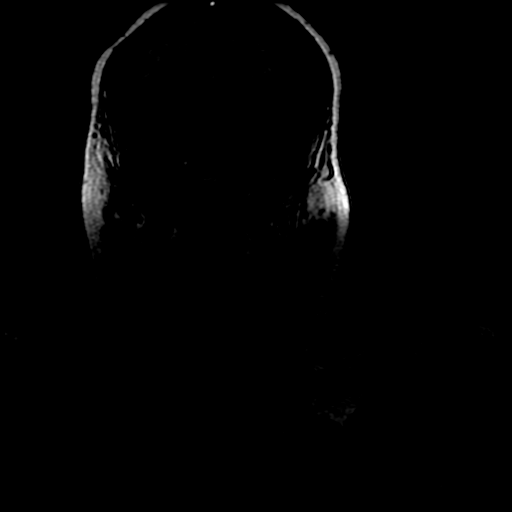
[im 43/108]
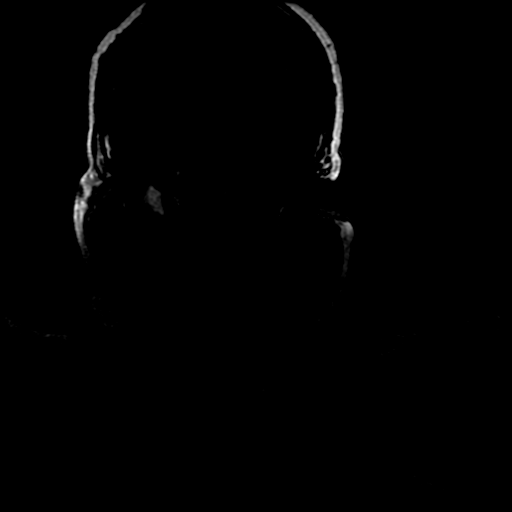
[im 65/108]
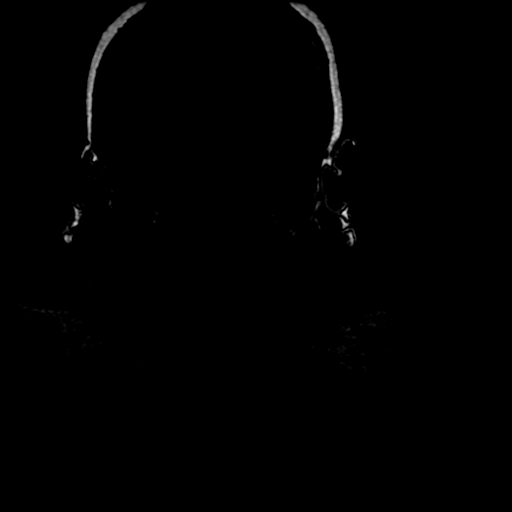
[im 86/108]
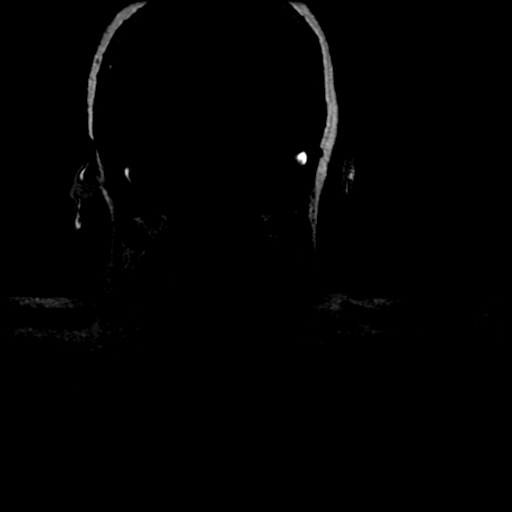
[im 108/108]
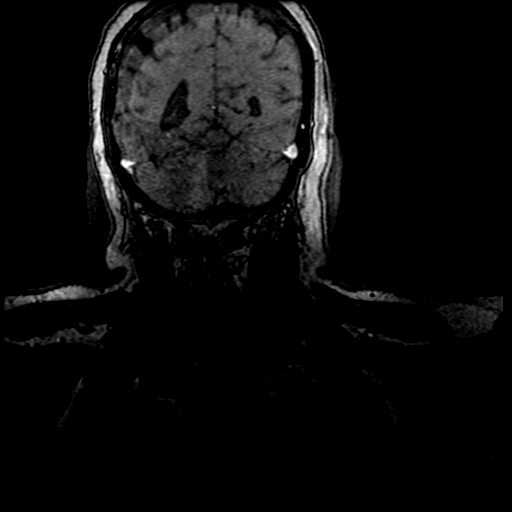

[24 of 48 positions shown; findings below may reference images not displayed]

FINDINGS: MRA NECK FINDINGS

Normal carotid and vertebral arteries allowing for motion obscuring
the most proximal portions.

MRA HEAD FINDINGS

POSTERIOR CIRCULATION:

--Vertebral arteries: Normal

--Inferior cerebellar arteries: Normal.

--Basilar artery: Normal.

--Superior cerebellar arteries: Normal.

--Posterior cerebral arteries: Normal.

ANTERIOR CIRCULATION:

--Intracranial internal carotid arteries: Normal.

--Anterior cerebral arteries (ACA): Normal.

--Middle cerebral arteries (MCA): Normal.

ANATOMIC VARIANTS: None
IMPRESSION: Normal MRA of the head and neck allowing for motion obscuring the
most proximal cervical portions of the carotid and vertebral
arteries.

## 2022-01-05 MED ORDER — ACETAMINOPHEN 650 MG RE SUPP: 650.0000 mg | RECTAL | Status: DC | PRN

## 2022-01-05 MED ORDER — INSULIN ASPART 100 UNIT/ML IJ SOLN: 0.0000 [IU] | Freq: Three times a day (TID) | INTRAMUSCULAR | Status: DC

## 2022-01-05 MED ORDER — ALBUTEROL SULFATE (2.5 MG/3ML) 0.083% IN NEBU
2.5000 mg | INHALATION_SOLUTION | RESPIRATORY_TRACT | Status: DC | PRN
Start: 2022-01-05 — End: 2022-01-06
  Administered 2022-01-05: 2.5 mg via RESPIRATORY_TRACT
  Filled 2022-01-05: qty 3

## 2022-01-05 MED ORDER — SENNOSIDES-DOCUSATE SODIUM 8.6-50 MG PO TABS: 1.0000 | ORAL_TABLET | Freq: Every evening | ORAL | Status: DC | PRN

## 2022-01-05 MED ORDER — ASPIRIN EC 81 MG PO TBEC
81.0000 mg | DELAYED_RELEASE_TABLET | Freq: Every day | ORAL | Status: DC
  Administered 2022-01-05 – 2022-01-06 (×2): 81 mg via ORAL
  Filled 2022-01-05 (×2): qty 1

## 2022-01-05 MED ORDER — GADOBUTROL 1 MMOL/ML IV SOLN
7.0000 mL | Freq: Once | INTRAVENOUS | Status: AC | PRN
  Administered 2022-01-05: 7 mL via INTRAVENOUS

## 2022-01-05 MED ORDER — CLOPIDOGREL BISULFATE 75 MG PO TABS
75.0000 mg | ORAL_TABLET | Freq: Every day | ORAL | Status: DC
  Administered 2022-01-05: 75 mg via ORAL
  Filled 2022-01-05: qty 1

## 2022-01-05 MED ORDER — ACETAMINOPHEN 325 MG PO TABS: 650.0000 mg | ORAL_TABLET | ORAL | Status: DC | PRN

## 2022-01-05 MED ORDER — STROKE: EARLY STAGES OF RECOVERY BOOK
Freq: Once | Status: AC
  Filled 2022-01-05: qty 1

## 2022-01-05 MED ORDER — CLOPIDOGREL BISULFATE 75 MG PO TABS
75.0000 mg | ORAL_TABLET | Freq: Every day | ORAL | Status: DC
  Administered 2022-01-06: 75 mg via ORAL
  Filled 2022-01-05: qty 1

## 2022-01-05 MED ORDER — ACETAMINOPHEN 160 MG/5ML PO SOLN: 650.0000 mg | ORAL | Status: DC | PRN

## 2022-01-05 MED ORDER — SODIUM CHLORIDE 0.9 % IV SOLN: INTRAVENOUS | Status: DC

## 2022-01-05 NOTE — Progress Notes (Signed)
PROGRESS NOTE ? ?Brief Narrative: ?Brittney Larsen is a 71 y.o. female with a history of diet-controlled T2DM, HTN, CAD, tobacco use who presented with abrupt onset of dizziness and slurred speech that had resolved by the time of arrival to the ED. Head CT showed no acute intracranial pathology, though subsequent MRI revealed an acute right SCA territory cerebellar infarct. She was admitted this morning by Dr. Tonie Griffith.  ? ?Subjective: ?No slurred speech or dizziness, though hasn't gotten out of the stretcher in the ED as of this afternoon.  ? ?Objective: ?BP (!) 161/83   Pulse 60   Temp 98.2 ?F (36.8 ?C) (Oral)   Resp 18   Ht 5' (1.524 m)   Wt 68 kg   SpO2 95%   BMI 29.29 kg/m?   ?Gen: No distress ?Pulm: Nonlabored, normal expiratory duration with pan expiratory wheezes.   ?CV: RRR, no murmur, no JVD, no edema ?GI: Soft, NT, ND, +BS  ?Neuro: Alert and oriented. No focal deficits. ?Skin: No rashes, lesions or ulcers ? ?Assessment & Plan: ?Acute right SCA cerebellar CVA:  ?- PT/OT/SLP pending ?- Echocardiogram pending ?- Stroke neurology formal recommendations pending.  ?- Continue DAPT ?- Continue cardiac monitoring. NSR with PVC occasionally so far.  ?- Lipid panel pending ? ?Wheezing:  ?- Add albuterol neb prn ? ?Tobacco use:  ?- Cessation counseling provided ? ?T2DM: ?- Check HbA1c ?- SSI ? ?HTN:  ?- Permissive HTN for now (prn labetalol ordered)  ? ?Patrecia Pour, MD ?Pager on amion ?01/05/2022, 3:01 PM   ?

## 2022-01-05 NOTE — Assessment & Plan Note (Signed)
Check HgbA1c level.  ?Monitor blood sugar. SSI as needed for glycemic control. Pt denies taking medication at home for DM ?

## 2022-01-05 NOTE — ED Provider Notes (Addendum)
[facility] EMERGENCY DEPARTMENT Provider Note  CSN: [id number] Arrival date & time: 01/04/22 2120  Chief Complaint(s) Dizziness  HPI Brittney Larsen is a 71 y.o. female    The history is provided by the patient.  Dizziness Quality:  Head spinning Severity:  Moderate Onset quality:  Sudden Duration:  5 hours Timing:  Constant Progression:  Improving Chronicity:  New Relieved by:  Nothing Worsened by:  Nothing Associated symptoms: headaches (right sided), hearing loss and tinnitus   Associated symptoms: no nausea, no vision changes and no vomiting    Patient reported having difficulty ambulating, states that she felt off balance as if she just gotten off a roller coaster.   Past Medical History Past Medical History:  Diagnosis Date   Asthma    Coronary artery disease    Diabetes mellitus without complication ([place])    Hypertension    Patient Active Problem List   Diagnosis Date Noted   TIA (transient ischemic attack) 01/05/2022   Essential hypertension 01/05/2022   Diabetes mellitus type 2 in obese ([place]) 01/05/2022   Tobacco use 01/05/2022   Home Medication(s) Prior to Admission medications   Not on File                                                                                                                                    Allergies Penicillins  Review of Systems Review of Systems  HENT:  Positive for hearing loss and tinnitus.   Gastrointestinal:  Negative for nausea and vomiting.  Neurological:  Positive for dizziness and headaches (right sided).  As noted in HPI  Physical Exam Vital Signs  I have reviewed the triage vital signs BP (!) 146/87    Pulse 63    Temp 98.2 F (36.8 C) (Oral)    Resp 14    Ht 5' (1.524 m)    Wt 68 kg    SpO2 92%    BMI 29.29 kg/m   Physical Exam Vitals reviewed.  Constitutional:      General: She is not in acute distress.    Appearance: She is well-developed. She is not diaphoretic.  HENT:      Head: Normocephalic and atraumatic.     Nose: Nose normal.  Eyes:     General: No scleral icterus.       Right eye: No discharge.        Left eye: No discharge.     Conjunctiva/sclera: Conjunctivae normal.     Pupils: Pupils are equal, round, and reactive to light.  Cardiovascular:     Rate and Rhythm: Normal rate and regular rhythm.     Heart sounds: No murmur heard.   No friction rub. No gallop.  Pulmonary:     Effort: Pulmonary effort is normal. No respiratory distress.     Breath sounds: Normal breath sounds. No stridor. No rales.  Abdominal:     General: There  is no distension.     Palpations: Abdomen is soft.     Tenderness: There is no abdominal tenderness.  Musculoskeletal:        General: No tenderness.     Cervical back: Normal range of motion and neck supple.  Skin:    General: Skin is warm and dry.     Findings: No erythema or rash.  Neurological:     Mental Status: She is alert and oriented to person, place, and time.     Comments: Mental Status:  Alert and oriented to person, place, and time.  Attention and concentration normal.  Speech thick, but not slurred.  Recent memory is intact  Cranial Nerves:  II Visual Fields: Intact to confrontation. Visual fields intact. III, IV, VI: Pupils equal and reactive to light and near. Full eye movement without nystagmus  V Facial Sensation: Normal. No weakness of masticatory muscles  VII: No facial weakness or asymmetry  VIII Auditory Acuity: Grossly normal  IX/X: The uvula is midline; the palate elevates symmetrically  XI: Normal sternocleidomastoid and trapezius strength  XII: The tongue is midline. No atrophy or fasciculations.   Motor System: Muscle Strength: 5/5 and symmetric in the upper and lower extremities. Mild pronation with RUE no drift  Muscle Tone: Tone and muscle bulk are normal in the upper and lower extremities.  Coordination: Intact finger-to-nose. No tremor.  Sensation: Intact to light  touch. Gait: deferred     ED Results and Treatments Labs (all labs ordered are listed, but only abnormal results are displayed) Labs Reviewed  CBC - Abnormal; Notable for the following components:      Result Value   RBC 5.33 (*)    RDW 15.9 (*)    All other components within normal limits  COMPREHENSIVE METABOLIC PANEL - Abnormal; Notable for the following components:   Glucose, Bld 179 (*)    All other components within normal limits  CBG MONITORING, ED - Abnormal; Notable for the following components:   Glucose-Capillary 203 (*)    All other components within normal limits  I-STAT CHEM 8, ED - Abnormal; Notable for the following components:   Glucose, Bld 176 (*)    All other components within normal limits  RESP PANEL BY RT-PCR (FLU A&B, COVID) ARPGX2  PROTIME-INR  APTT  DIFFERENTIAL  URINALYSIS, ROUTINE W REFLEX MICROSCOPIC  CBG MONITORING, ED                                                                                                                         EKG  EKG Interpretation  Date/Time:  Tuesday January 04 2022 21:35:49 EST Ventricular Rate:  73 PR Interval:  192 QRS Duration: 80 QT Interval:  428 QTC Calculation: 471 R Axis:   42 Text Interpretation: Sinus rhythm with frequent Premature ventricular complexes Otherwise normal ECG No previous ECGs available Confirmed by Addison Lank [phone]) on 01/05/2022 2:42:06 AM       Radiology CT HEAD WO CONTRAST  Result Date: 01/04/2022 CLINICAL DATA:  slurred speech EXAM: CT HEAD WITHOUT CONTRAST TECHNIQUE: Contiguous axial images were obtained from the base of the skull through the vertex without intravenous contrast. RADIATION DOSE REDUCTION: This exam was performed according to the departmental dose-optimization program which includes automated exposure control, adjustment of the mA and/or kV according to patient size and/or use of iterative reconstruction technique. COMPARISON:  None. BRAIN: BRAIN Cerebral  ventricle sizes are concordant with the degree of cerebral volume loss. Patchy and confluent areas of decreased attenuation are noted throughout the deep and periventricular white matter of the cerebral hemispheres bilaterally, compatible with chronic microvascular ischemic disease. No evidence of large-territorial acute infarction. No parenchymal hemorrhage. No mass lesion. No extra-axial collection. No mass effect or midline shift. No hydrocephalus. Basilar cisterns are patent. Vascular: No hyperdense vessel. Skull: No acute fracture or focal lesion. Sinuses/Orbits: Paranasal sinuses and mastoid air cells are clear. The orbits are unremarkable. Other: None. IMPRESSION: No acute intracranial abnormality in a patient with chronic microvascular ischemic changes diffuse atrophy. Electronically Signed   By: Iven Finn M.D.   On: 01/04/2022 22:32   MR BRAIN WO CONTRAST  Result Date: 01/05/2022 CLINICAL DATA:  71 year old female with neurologic deficit. Slurred speech. TIA. EXAM: MRI HEAD WITHOUT CONTRAST TECHNIQUE: Multiplanar, multiecho pulse sequences of the brain and surrounding structures were obtained without intravenous contrast. COMPARISON:  Head CT yesterday.  Cervical spine MRI 03/06/2018. FINDINGS: Brain: Subtle abnormal diffusion in the right cerebellum SCA territory (series 5, image 30) with 2 more conspicuous small foci of restricted diffusion in the posterosuperior cerebellum on that side (series 5, image 69). Faint if any associated T2 and FLAIR hyperintensity. No hemorrhage or mass effect. Small contralateral left cerebellar chronic lacunar infarct on series 10, image 8. No other diffusion restriction. Patchy and confluent bilateral abnormal cerebral white matter T2 and FLAIR hyperintensity which in the bilateral corona radiata and centrum semiovale most resembles chronic white matter lacunar infarcts. No cerebral cortical encephalomalacia or chronic cerebral blood products identified. No midline  shift, mass effect, evidence of mass lesion, ventriculomegaly, extra-axial collection or acute intracranial hemorrhage. Cervicomedullary junction and pituitary are within normal limits. Deep gray nuclei and brainstem appear spared. Vascular: Major intracranial vascular flow voids are preserved. Skull and upper cervical spine: Negative for age visible cervical spine. Visualized bone marrow signal is within normal limits. Sinuses/Orbits: Negative orbits. Paranasal Visualized paranasal sinuses and mastoids are stable and well aerated. Other: Grossly normal visible internal auditory structures. Negative visible scalp and face. IMPRESSION: 1. Subtle Acute Infarct of the right cerebellum SCA territory. No associated hemorrhage or mass effect. 2. Underlying chronic small vessel disease most pronounced in the bilateral cerebral white matter, also with mild left cerebellar involvement. Electronically Signed   By: Genevie Ann M.D.   On: 01/05/2022 06:23    Pertinent labs & imaging results that were available during my care of the patient were reviewed by me and considered in my medical decision making (see MDM for details).  Medications Ordered in ED Medications  sodium chloride flush (NS) 0.9 % injection 3 mL (has no administration in time range)  aspirin tablet 325 mg (325 mg Oral Given 01/04/22 2210)  Procedures Procedures  (including critical care time)  Medical Decision Making / ED Course    Complexity of Problem:  Co morbidities that complicate the patient evaluation Hypertension, hyperlipidemia, diabetes  Additional history obtained: N/A  Patient's presenting problem/concern and DDX listed below: Headache  Associated dizziness and feeling off balance. Hemorrhagic versus ischemic stroke, peripheral vertigo, migraine, hypoglycemia, anemia, tachydysrhythmias,  electrolyte derangements, metabolic derangement.     Complexity of Data:   Cardiac Monitoring: The patient was maintained on a cardiac monitor.   I personally viewed and interpreted the cardiac monitored which showed an underlying rhythm of normal sinus rhythm with rates in the 60s to 70s.  Infrequent PVCs.  No dysrhythmias or blocks.   Laboratory Tests ordered listed below with my independent interpretation: CBC without leukocytosis or anemia No significant electrolyte derangements or renal sufficiency Hyperglycemia without evidence of DKA.     Imaging Studies ordered listed below with my independent interpretation: CT head without ICH or large volume infarct.  Confirmed by radiology.     ED Course:    Based on above concerns, hospitalization possible: Yes  Assessment, Intervention, and Reassessment: Headache and dizziness Symptoms have now subsided without intervention. During MSE process neurology was called and evaluated the patient.  In the MSE note it states that Dr. Theda Sers recommended stroke work-up. Will speak to Neuro again and have them follow along during admission Spoke with Dr. Theda Sers. He will have the team follow during admission. Plan to admit for stroke/TIA work up MRI ordered    Final Clinical Impression(s) / ED Diagnoses Final diagnoses:  Dizziness  TIA (transient ischemic attack)           This chart was dictated using voice recognition software.  Despite best efforts to proofread,  errors can occur which can change the documentation meaning.      Fatima Blank, MD 01/05/22 [phone]

## 2022-01-05 NOTE — Evaluation (Addendum)
Physical Therapy Evaluation ?Patient Details ?Name: Brittney Larsen ?MRN: [id number] ?DOB: June 02, 1951 ?Today's Date: 01/05/2022 ? ?History of Present Illness ? 71 y.o. female with a history of diet-controlled T2DM, HTN, CAD, tobacco use who presented with abrupt onset of dizziness and slurred speech that had resolved by the time of arrival to the ED. Head CT showed no acute intracranial pathology, though subsequent MRI revealed an acute right SCA territory cerebellar infarct.  ?Clinical Impression ? Pt presents to PT with reported deficits in facial sensation and cardiopulmonary endurance, otherwise at her functional baseline. Pt is able to perform multiple dynamic gait tasks independently, only reporting increased DOE compared to baseline, citing an asthma flare. PT encourages frequent mobilization during this admission as well as as much time spent in an upright position as possible. PT to follow to monitor endurance during this admission. Pt demonstrates no post-acute PT needs.   ?   ? ?Recommendations for follow up therapy are one component of a multi-disciplinary discharge planning process, led by the attending physician.  Recommendations may be updated based on patient status, additional functional criteria and insurance authorization. ? ?Follow Up Recommendations No PT follow up ? ?  ?Assistance Recommended at Discharge None  ?Patient can return home with the following ?   ? ?  ?Equipment Recommendations None recommended by PT  ?Recommendations for Other Services ?    ?  ?Functional Status Assessment Patient has not had a recent decline in their functional status  ? ?  ?Precautions / Restrictions Precautions ?Precautions: Other (comment) (monitor SpO2) ?Restrictions ?Weight Bearing Restrictions: No  ? ?  ? ?Mobility ? Bed Mobility ?Overal bed mobility: Independent ?  ?  ?  ?  ?  ?  ?  ?  ? ?Transfers ?Overall transfer level: Independent ?  ?  ?  ?  ?  ?  ?  ?  ?  ?  ? ?Ambulation/Gait ?Ambulation/Gait  assistance: Modified independent (Device/Increase time) ?Gait Distance (Feet): 250 Feet ?Assistive device: None ?Gait Pattern/deviations: Step-through pattern ?Gait velocity: functional ?Gait velocity interpretation: >2.62 ft/sec, indicative of community ambulatory ?  ?General Gait Details: pt is able to change gait speed, stop abruptly, perform head turns, walk backward, and change stride length all without loss of balance ? ?Stairs ?Stairs:  (pt denies concerns about managing one step to enter home) ?  ?  ?  ?  ? ?Wheelchair Mobility ?  ? ?Modified Rankin (Stroke Patients Only) ?Modified Rankin (Stroke Patients Only) ?Pre-Morbid Rankin Score: No symptoms ?Modified Rankin: No significant disability ? ?  ? ?Balance Overall balance assessment: Independent ?  ?  ?  ?  ?  ?  ?  ?  ?  ?  ?  ?  ?  ?  ?  ?  ?  ?  ?   ? ? ? ?Pertinent Vitals/Pain Pain Assessment ?Pain Assessment: No/denies pain  ? ? ?Home Living Family/patient expects to be discharged to:: Private residence ?Living Arrangements: Spouse/significant other ?Available Help at Discharge: Family;Available 24 hours/day ?Type of Home: House ?Home Access: Stairs to enter ?Entrance Stairs-Rails: None ?Entrance Stairs-Number of Steps: 1 ?  ?Home Layout: Laundry or work area in basement;Able to live on main level with bedroom/bathroom ?Home Equipment: None ?   ?  ?Prior Function Prior Level of Function : Independent/Modified Independent;Driving ?  ?  ?  ?  ?  ?  ?  ?  ?  ? ? ?Hand Dominance  ? Dominant Hand: Right ? ?  ?Extremity/Trunk  Assessment  ? Upper Extremity Assessment ?Upper Extremity Assessment: LUE deficits/detail ?LUE Coordination: decreased fine motor (chronic LUE tremor) ?  ? ?Lower Extremity Assessment ?Lower Extremity Assessment: Overall WFL for tasks assessed ?  ? ?Cervical / Trunk Assessment ?Cervical / Trunk Assessment: Other exceptions ?Cervical / Trunk Exceptions: pt reports altered sensation in face "it feels like I am pulling a mask off"   ?Communication  ? Communication: No difficulties  ?Cognition Arousal/Alertness: Awake/alert ?Behavior During Therapy: [facility] for tasks assessed/performed ?Overall Cognitive Status: Within Functional Limits for tasks assessed ?  ?  ?  ?  ?  ?  ?  ?  ?  ?  ?  ?  ?  ?  ?  ?  ?  ?  ?  ? ?  ?General Comments General comments (skin integrity, edema, etc.): pt reports difficulty breathing along with history of asthma. Noted to sound congested during session. SpO2 in low 90s on room air ? ?  ?Exercises    ? ?Assessment/Plan  ?  ?PT Assessment Patient needs continued PT services  ?PT Problem List Decreased activity tolerance ? ?   ?  ?PT Treatment Interventions Therapeutic exercise;Therapeutic activities;Patient/family education;Gait training   ? ?PT Goals (Current goals can be found in the Care Plan section)  ?Acute Rehab PT Goals ?Patient Stated Goal: to improve breathing ?PT Goal Formulation: With patient ?Time For Goal Achievement: 01/19/22 ?Potential to Achieve Goals: Good ?Additional Goals ?Additional Goal #1: Pt will ambulate for >1000' reporting a DOE of 3/10 or less to demonstrate improved tolerance for community mobility ? ?  ?Frequency Min 1X/week ?  ? ? ?Co-evaluation   ?  ?  ?  ?  ? ? ?  ?AM-PAC PT "6 Clicks" Mobility  ?Outcome Measure Help needed turning from your back to your side while in a flat bed without using bedrails?: None ?Help needed moving from lying on your back to sitting on the side of a flat bed without using bedrails?: None ?Help needed moving to and from a bed to a chair (including a wheelchair)?: None ?Help needed standing up from a chair using your arms (e.g., wheelchair or bedside chair)?: None ?Help needed to walk in hospital room?: None ?Help needed climbing 3-5 steps with a railing? : None ?6 Click Score: 24 ? ?  ?End of Session   ?Activity Tolerance: Patient tolerated treatment well ?Patient left: in bed;with call bell/phone within reach;with family/visitor present ?Nurse Communication:  Mobility status ?PT Visit Diagnosis: Other abnormalities of gait and mobility (R26.89) ?  ? ?Time: 0938-1829 ?PT Time Calculation (min) (ACUTE ONLY): 15 min ? ? ?Charges:   PT Evaluation ?$PT Eval Low Complexity: 1 Low ?  ?  ?   ? ? ?Zenaida Niece, PT, DPT ?Acute Rehabilitation ?Pager: [phone] ?Office [phone] ? ? ?Zenaida Niece ?01/05/2022, 5:35 PM ?

## 2022-01-05 NOTE — Progress Notes (Signed)
Patient arrived in the unit at 2125 pm form [facility], A&O x4, denied of pain, and resting in a bed at this moment, telemetry monitor initiated, and will continue to monitor closely. ?

## 2022-01-05 NOTE — ED Notes (Signed)
Johnanna Schneiders daughter [phone] requesting an update on the patient  ?

## 2022-01-05 NOTE — Progress Notes (Signed)
?  Echocardiogram ?2D Echocardiogram has been performed. ? Brittney Larsen ?01/05/2022, 12:16 PM ?

## 2022-01-05 NOTE — Assessment & Plan Note (Signed)
Smoking cessation education before discharge. Encouraged to quit smoking. Health hazards of tobacco use reviewed with pt ?

## 2022-01-05 NOTE — Assessment & Plan Note (Signed)
Ms. Wisdom will be observed on medical telemetry floor for TIA.  Will obtain MRI of brain to rule out acute ischemic CVA.  Will evaluate carotids for large vessel occlusion/stenosis. ?Obtain echocardiogram to evaluate for PFO, wall motion and ejection fraction. ?Hypertension of 220/110 will be allowed for 24 hours per stroke protocol.  After which blood pressure will be slowly reduced to goal level. ?Antiplatelet therapy with aspirin and plavix daily for 3 weeks then can use plavix monotherapy. ?Check lipid panel.  ?Neurochecks per stroke protocol. Neurology consulted by Er physician ? ?

## 2022-01-05 NOTE — ED Notes (Signed)
Brittney Larsen daughter [phone] requesting an update on the patient  ?

## 2022-01-05 NOTE — ED Notes (Signed)
Pt transported to MRI 

## 2022-01-05 NOTE — Progress Notes (Signed)
Carotid duplex has been completed. ? ? ?Results can be found under chart review under CV PROC. ?01/05/2022 2:46 PM ?Millisa Giarrusso RVT, RDMS ? ?

## 2022-01-05 NOTE — Plan of Care (Signed)
?  Problem: Education: ?Goal: Knowledge of disease or condition will improve ?Outcome: Progressing ?Goal: Knowledge of secondary prevention will improve (SELECT ALL) ?Outcome: Progressing ?Goal: Knowledge of patient specific risk factors will improve (INDIVIDUALIZE FOR PATIENT) ?Outcome: Progressing ?  ?Problem: Coping: ?Goal: Will verbalize positive feelings about self ?Outcome: Progressing ?Goal: Will identify appropriate support needs ?Outcome: Progressing ?  ?Problem: Health Behavior/Discharge Planning: ?Goal: Ability to manage health-related needs will improve ?Outcome: Progressing ?  ?Problem: Self-Care: ?Goal: Ability to participate in self-care as condition permits will improve ?Outcome: Progressing ?Goal: Verbalization of feelings and concerns over difficulty with self-care will improve ?Outcome: Progressing ?Goal: Ability to communicate needs accurately will improve ?Outcome: Progressing ?  ?Problem: Nutrition: ?Goal: Risk of aspiration will decrease ?Outcome: Progressing ?Goal: Dietary intake will improve ?Outcome: Progressing ?  ?Problem: Intracerebral Hemorrhage Tissue Perfusion: ?Goal: Complications of Intracerebral Hemorrhage will be minimized ?Outcome: Progressing ?  ?Problem: Ischemic Stroke/TIA Tissue Perfusion: ?Goal: Complications of ischemic stroke/TIA will be minimized ?Outcome: Progressing ?  ?

## 2022-01-05 NOTE — H&P (Signed)
?History and Physical  ? ? ?Patient: Brittney Larsen NLG:[id number] DOB: 1951/06/16 ?DOA: 01/04/2022 ?DOS: the patient was seen and examined on 01/05/2022 ?PCP: Pcp, No  ?Patient coming from: Home ? ?Chief Complaint:  ?Chief Complaint  ?Patient presents with  ? Dizziness  ? ? ?HPI: Brittney Larsen is a 71 y.o. female with medical history significant of HTN, Diet controlled DM. Asthma, CAD, tobacco use who presents for evaluation of sudden onset of Slurred speech.  She reports she was sitting at home with her husband last night when she suddenly became dizzy and had ringing in her ears with a right-sided headache and some slurred speech.  Symptoms lasted for 10 to 15 minutes and then resolved.  She did not have any nausea or vomiting.  She denies chest pain, palpitations, abdominal pain, urinary symptoms, cough or shortness of breath.  She states that she felt like she was off balance like she had gotten off of a roller coaster and she did not have any syncope and did not fall.  Reports she has never had symptoms like that in the past.  She does smoke half pack a day.  She has not had any recent changes in her medications.  No recent travel.  No history of DVTs or PEs.  She is not taking an aspirin at home. ?CT of the head is negative in the emergency room.  She has been hemodynamically stable in the emergency room.  Symptoms have completely resolved at this time. ? ?Review of Systems: As mentioned in the history of present illness. All other systems reviewed and are negative. ?Past Medical History:  ?Diagnosis Date  ? Asthma   ? Coronary artery disease   ? Diabetes mellitus without complication ([place])   ? Hypertension   ? ?Past Surgical History:  ?Procedure Laterality Date  ? APPENDECTOMY    ? ?Social History:  reports that she has been smoking cigarettes. She has been smoking an average of .5 packs per day. She has never used smokeless tobacco. She reports that she does not drink alcohol and does not use  drugs. ? ?Allergies  ?Allergen Reactions  ? Penicillins Itching  ? ? ?History reviewed. No pertinent family history. ? ?Prior to Admission medications   ?Not on File  ? ? ?Physical Exam: ?Vitals:  ? 01/05/22 0330 01/05/22 0400 01/05/22 0430 01/05/22 0500  ?BP: (!) 141/84 (!) 143/83 131/87 (!) 146/87  ?Pulse: 61 62 65 63  ?Resp: 15 13 14 14   ?Temp:      ?TempSrc:      ?SpO2: 95% 93% 93% 92%  ?Weight:      ?Height:      ? ?General: WDWN, Alert and oriented x3.  ?Eyes: EOMI, PERRL, conjunctivae normal.  Sclera nonicteric ?HENT:  [place]/AT, external ears normal.  Nares patent without epistasis.  Mucous membranes are moist. Posterior pharynx clear of any exudate.  Tongue midline with normal extension and no deviation ?Neck: Soft, normal range of motion, supple, no masses, Trachea midline ?Respiratory: clear to auscultation bilaterally, no wheezing, no crackles. Normal respiratory effort.  ?Cardiovascular: Regular rate and rhythm, no murmurs / rubs / gallops. No extremity edema. 2+ pedal pulses. ?Abdomen: Soft, no tenderness, nondistended, no rebound or guarding.  No masses palpated. Bowel sounds normoactive ?Musculoskeletal: FROM. no cyanosis. No joint deformity upper and lower extremities. Normal muscle tone.  ?Skin: Warm, dry, intact no rashes, lesions, ulcers. No induration ?Neurologic: CN 2-12 grossly intact.  Normal speech. Sensation intact, patella DTR +1 bilaterally.  Strength 5/5 in all extremities.   ?Psychiatric: Normal judgment and insight. Normal mood.  ? ? ?Data Reviewed: ?Lab Work: CBC unremarkable.  CMP with glucose of 179 otherwise normal.  COVID-negative.  ?Influenza A and B negative ?CT head shows no acute intracranial abnormalities.  Chronic microvascular ischemic changes diffusely present with atrophy present. ? ?EKG shows normal sinus rhythm with occasional PVCs.  No acute ST elevation or depression.  QTc 471 ? ?Assessment and Plan: ?* TIA (transient ischemic attack)- (present on admission) ?Ms. Swaim  will be observed on medical telemetry floor for TIA.  Will obtain MRI of brain to rule out acute ischemic CVA.  Will evaluate carotids for large vessel occlusion/stenosis. ?Obtain echocardiogram to evaluate for PFO, wall motion and ejection fraction. ?Hypertension of 220/110 will be allowed for 24 hours per stroke protocol.  After which blood pressure will be slowly reduced to goal level. ?Antiplatelet therapy with aspirin and plavix daily for 3 weeks then can use plavix monotherapy. ?Check lipid panel.  ?Neurochecks per stroke protocol. Neurology consulted by Er physician ? ? ?Essential hypertension ?Resume lisinopril once permissive hypertension duration ends.  ?Monitor BP ? ?Diabetes mellitus type 2 in obese [facility]) ?Check HgbA1c level.  ?Monitor blood sugar. SSI as needed for glycemic control. Pt denies taking medication at home for DM ? ?Tobacco use ?Smoking cessation education before discharge. Encouraged to quit smoking. Health hazards of tobacco use reviewed with pt ? ?Advance Care Planning: Code Status:  Full Code ? ?Consults: Neurology consulted by ER physician and will see patient ? ?Family Communication: Diagnosis and plan discussed with patient and her husband who is at bedside.  They verbalized understanding agree with plan.  Questions answered.  Further recommendations to follow as clinical indicated ? ?Author: ?Eben Burow, MD ?01/05/2022 5:36 AM ? ?For on call review [email or web address].  ?

## 2022-01-05 NOTE — ED Notes (Signed)
Pt transported to Vascular 

## 2022-01-05 NOTE — Evaluation (Signed)
Occupational Therapy Evaluation ?Patient Details ?Name: Brittney Larsen ?MRN: [id number] ?DOB: 04-Aug-1951 ?Today's Date: 01/05/2022 ? ? ?History of Present Illness 71 y.o. female with a history of diet-controlled T2DM, HTN, CAD, tobacco use who presented with abrupt onset of dizziness and slurred speech that had resolved by the time of arrival to the ED. Head CT showed no acute intracranial pathology, though subsequent MRI revealed an acute right SCA territory cerebellar infarct.  ? ?Clinical Impression ?  ?Patient admitted for the diagnosis above.  PTA she lives with her spouse and needed no assist with any aspect of ADL/IADL, or mobility.  She continues to drive, and noted that on 2/28 she spread several bags of mulch around her house.  Patient's only complaint, is that she feels as if she has "a mask on her face",  noting sensory changes.  Otherwise, she is needing generalized supervision for in room mobility, mobility in the halls, and for ADL completion from a sit/stand level.  OT can follow in the acute setting if she remains, but no significant post acute OT needs exist at this time.  Recommend home with family when medically cleared.   ?   ? ?Recommendations for follow up therapy are one component of a multi-disciplinary discharge planning process, led by the attending physician.  Recommendations may be updated based on patient status, additional functional criteria and insurance authorization.  ? ?Follow Up Recommendations ? No OT follow up recommended at this time.  OT will follow in the acute setting for changes. ?  ?Assistance Recommended at Discharge PRN  ?Patient can return home with the following   ? ?  ?Functional Status Assessment ? Patient has had a recent decline in their functional status and demonstrates the ability to make significant improvements in function in a reasonable and predictable amount of time.  ?Equipment Recommendations ? None recommended by OT  ?  ?Recommendations for Other  Services   ? ? ?  ?Precautions / Restrictions Precautions ?Precautions: Fall ?Restrictions ?Weight Bearing Restrictions: No  ? ?  ? ?Mobility Bed Mobility ?Overal bed mobility: Modified Independent ?  ?  ?  ?  ?  ?  ?  ?  ? ?Transfers ?Overall transfer level: Needs assistance ?  ?Transfers: Sit to/from Stand, Bed to chair/wheelchair/BSC ?Sit to Stand: Supervision ?  ?  ?Step pivot transfers: Supervision ?  ?  ?General transfer comment: Patient able to walk the unit with no assist or AD ?  ? ?  ?Balance Overall balance assessment: Mild deficits observed, not formally tested ?  ?  ?  ?  ?  ?  ?  ?  ?  ?  ?  ?  ?  ?  ?  ?  ?  ?  ?   ? ?ADL either performed or assessed with clinical judgement  ? ?ADL   ?  ?  ?  ?  ?  ?  ?  ?  ?  ?  ?  ?  ?  ?  ?  ?  ?  ?  ?  ?General ADL Comments: Generalized supervision only from sit/stand level.  ? ? ? ?Vision Baseline Vision/History: 1 Wears glasses ?Patient Visual Report: No change from baseline ?   ?   ?Perception Perception ?Perception: Within Functional Limits ?  ?Praxis Praxis ?Praxis: Intact ?  ? ?Pertinent Vitals/Pain Pain Assessment ?Pain Assessment: Faces ?Faces Pain Scale: No hurt ?Pain Intervention(s): Monitored during session  ? ? ? ?Hand Dominance Right ?  ?  Extremity/Trunk Assessment Upper Extremity Assessment ?Upper Extremity Assessment: Overall WFL for tasks assessed ?  ?Lower Extremity Assessment ?Lower Extremity Assessment: Defer to PT evaluation ?  ?Cervical / Trunk Assessment ?Cervical / Trunk Assessment: Normal ?  ?Communication Communication ?Communication: No difficulties ?  ?Cognition Arousal/Alertness: Awake/alert ?Behavior During Therapy: [facility] for tasks assessed/performed ?Overall Cognitive Status: Within Functional Limits for tasks assessed ?  ?  ?  ?  ?  ?  ?  ?  ?  ?  ?  ?  ?  ?  ?  ?  ?  ?  ?  ?General Comments   VSS on RA ? ?  ?Exercises   ?  ?Shoulder Instructions    ? ? ?Home Living Family/patient expects to be discharged to:: Private residence ?Living  Arrangements: Spouse/significant other ?Available Help at Discharge: Family;Available 24 hours/day ?Type of Home: House ?Home Access: Stairs to enter ?Entrance Stairs-Number of Steps: 1 ?  ?Home Layout: Laundry or work area in basement;Able to live on main level with bedroom/bathroom ?  ?  ?Bathroom Shower/Tub: Tub/shower unit ?  ?Bathroom Toilet: Standard ?Bathroom Accessibility: Yes ?How Accessible: Accessible via walker ?Home Equipment: None ?  ?  ?  ? ?  ?Prior Functioning/Environment Prior Level of Function : Independent/Modified Independent;Driving ?  ?  ?  ?  ?  ?  ?  ?  ?  ? ?  ?  ?OT Problem List: Impaired sensation ?  ?   ?OT Treatment/Interventions: Self-care/ADL training;Therapeutic activities  ?  ?OT Goals(Current goals can be found in the care plan section) Acute Rehab OT Goals ?Patient Stated Goal: Find out what happended, and return home ?OT Goal Formulation: With patient ?Time For Goal Achievement: 01/19/22 ?Potential to Achieve Goals: Good  ?OT Frequency: Min 2X/week ?  ? ?Co-evaluation   ?  ?  ?  ?  ? ?  ?AM-PAC OT "6 Clicks" Daily Activity     ?Outcome Measure Help from another person eating meals?: None ?Help from another person taking care of personal grooming?: None ?Help from another person toileting, which includes using toliet, bedpan, or urinal?: A Little ?Help from another person bathing (including washing, rinsing, drying)?: A Little ?Help from another person to put on and taking off regular upper body clothing?: None ?Help from another person to put on and taking off regular lower body clothing?: A Little ?6 Click Score: 21 ?  ?End of Session Equipment Utilized During Treatment: Gait belt ?Nurse Communication: Mobility status ? ?Activity Tolerance: Patient tolerated treatment well ?Patient left: in chair;with call bell/phone within reach ? ?OT Visit Diagnosis: Unsteadiness on feet (R26.81)  ?              ?Time: 9357-0177 ?OT Time Calculation (min): 24 min ?Charges:  OT General  Charges ?$OT Visit: 1 Visit ?OT Evaluation ?$OT Eval Moderate Complexity: 1 Mod ?OT Treatments ?$Self Care/Home Management : 8-22 mins ? ?01/05/2022 ? ?RP, OTR/L ? ?Acute Rehabilitation Services ? ?Office:  [phone] ? ? ?Fred Franzen D Deeana Atwater ?01/05/2022, 3:57 PM ?

## 2022-01-05 NOTE — Assessment & Plan Note (Signed)
Resume lisinopril once permissive hypertension duration ends.  ?Monitor BP ?

## 2022-01-05 NOTE — Consult Note (Signed)
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 female with PMH significant for asthma, hypertension, CAD who presents with difficulty ambulating, feeling very off balance and vertigo along with loud noise on the right.  Reports she had a long day and spent most of her day out mulching. In the evening around 2000, she was going back to her closet when she had sudden onset, feeling very woozy, walking like she is drunk, mild R sided headache and everything felt spinning and her face felt like she has a mask on. She came to the ED when it persisted.  She denies any prior history of strokes, no history of diabetes, endorses she has hypertension but is relatively well controlled, denies any history of hyperlipidemia.  She smokes about half a pack a day and has been doing so for several years.  LKW: 2000 on 01/04/22 mRS: 0 tNKASE: outside window and symptoms too mild Thrombectomy: symptoms too mild. NIHSS components Score: Comment  1a Level of Conscious 0[x]  1[]  2[]  3[]      1b LOC Questions 0[x]  1[]  2[]       1c LOC Commands 0[x]  1[]  2[]       2 Best Gaze 0[x]  1[]  2[]       3 Visual 0[x]  1[]  2[]  3[]      4 Facial Palsy 0[x]  1[]  2[]  3[]      5a Motor Arm - left 0[x]  1[]  2[]  3[]  4[]  UN[]    5b Motor Arm - Right 0[x]  1[]  2[]  3[]  4[]  UN[]    6a Motor Leg - Left 0[x]  1[]  2[]  3[]  4[]  UN[]    6b Motor Leg - Right 0[x]  1[]  2[]  3[]  4[]  UN[]    7 Limb Ataxia 0[x]  1[]  2[]  3[]  UN[]     8 Sensory 0[]  1[]  2[]  UN[]    Facial numbness  9 Best Language 0[x]  1[]  2[]  3[]      10 Dysarthria 0[x]  1[]  2[]  UN[]      11 Extinct. and Inattention 0[x]  1[]  2[]       TOTAL: 1       ROS   Constitutional Denies weight loss, fever and  chills.   HEENT Denies changes in vision and hearing.   Respiratory Denies SOB and cough.   CV Denies palpitations and CP   GI Denies abdominal pain, nausea, vomiting and diarrhea.   GU Denies dysuria and urinary frequency.   MSK Denies myalgia and joint pain.   Skin Denies rash and pruritus.   Neurological Denies headache and syncope.   Psychiatric Denies recent changes in mood. Denies anxiety and depression.    Past History   Past Medical History:  Diagnosis Date   Asthma    Coronary artery disease    Diabetes mellitus without complication ([place])    Hypertension    Past Surgical History:  Procedure Laterality Date   APPENDECTOMY     History reviewed. No pertinent family history. Social History   Socioeconomic History   Marital status: Married    Spouse name: Not on file   Number of children: Not on file   Years of education: Not on file   Highest education level: Not on file  Occupational History   Not on file  Tobacco Use   Smoking status: Every Day    Packs/day: 0.50    Types: Cigarettes   Smokeless tobacco: Never  Substance and Sexual Activity   Alcohol use: Never   Drug use: Never   Sexual activity: Not on file  Other Topics Concern   Not on file  Social History Narrative   Not on file   Social Determinants of Health   Financial Resource Strain: Not on file  Food Insecurity: Not on file  Transportation Needs: Not on file  Physical Activity: Not on file  Stress: Not on file  Social Connections: Not on file   Allergies  Allergen Reactions   Penicillins Itching    Medications  (Not in a hospital admission)    Vitals   Vitals:   01/05/22 0745 01/05/22 0800 01/05/22 0830 01/05/22 0900  BP: (!) 133/98 (!) 144/98 140/89 138/79  Pulse: 65 71 66 (!) 56  Resp: 15 17 16 17   Temp:      TempSrc:      SpO2: 95% 97% 93% 94%  Weight:      Height:         Body mass index is 29.29 kg/m.  Physical Exam   General: Laying comfortably in bed; in no  acute distress.  HENT: Normal oropharynx and mucosa. Normal external appearance of ears and nose. Xanthelasma BL Neck: Supple, no pain or tenderness  CV: No JVD. No peripheral edema.  Pulmonary: Symmetric Chest rise. Normal respiratory effort.  Abdomen: Soft to touch, non-tender.  Ext: No cyanosis, edema, or deformity  Skin: No rash. Normal palpation of skin.   Musculoskeletal: Normal digits and nails by inspection. No clubbing.   Neurologic Examination  Mental status/Cognition: Alert, oriented to self, place, month and year, good attention.  Speech/language: Fluent, comprehension intact, object naming intact, repetition intact.  Cranial nerves:   CN II Pupils equal and reactive to light, no VF deficits    CN III,IV,VI EOM intact, no gaze preference or deviation, no nystagmus    CN V normal sensation in V1, V2, and V3 segments bilaterally. Has subjective facial numbness of her entire face.   CN VII no asymmetry, no nasolabial fold flattening    CN VIII normal hearing to speech    CN IX & X normal palatal elevation, no uvular deviation    CN XI 5/5 head turn and 5/5 shoulder shrug bilaterally    CN XII midline tongue protrusion    Motor:  Muscle bulk: normal, tone normal, pronator drift none tremor yes has a intention tremor. Mvmt Root Nerve  Muscle Right Left Comments  SA C5/6 Ax Deltoid 5 5   EF C5/6 Mc Biceps 5 5   EE C6/7/8 Rad Triceps 5 5   WF C6/7 Med FCR     WE C7/8 PIN ECU     F Ab C8/T1 U ADM/FDI 5 5   HF L1/2/3 Fem Illopsoas 4+ 4+   KE L2/3/4 Fem Quad 5 5   DF L4/5 D Peron Tib Ant 5 5   PF S1/2 Tibial Grc/Sol 5 5    Reflexes:  Right Left Comments  Pectoralis      Biceps (C5/6) 2 2   Brachioradialis (C5/6) 2 2    Triceps (C6/7) 2 2    Patellar (L3/4) 2 2    Achilles (S1) 4 4 Clonus BL   [place]      Plantar     Jaw jerk  Sensation:  Light touch Intact throughout, subjective facial numbness across the entire face.   Pin prick    Temperature    Vibration    Proprioception    Coordination/Complex Motor:  - Finger to Nose intact BL - Heel to shin intact BL - Rapid alternating movement are slowed in RUE compared to LUE - Gait: Deferred for patient safety.  Labs   CBC:  Recent Labs  Lab 01/04/22 2202 01/04/22 2305  WBC 6.7  --   NEUTROABS 4.3  --   HGB 14.9 15.0  HCT 45.0 44.0  MCV 84.4  --   PLT 256  --     Basic Metabolic Panel:  Lab Results  Component Value Date   NA 141 01/04/2022   K 3.5 01/04/2022   CO2 27 01/04/2022   GLUCOSE 176 (H) 01/04/2022   BUN 17 01/04/2022   CREATININE 0.80 01/04/2022   CALCIUM 9.2 01/04/2022   GFRNONAA >60 01/04/2022   Lipid Panel: No results found for: LDLCALC HgbA1c: No results found for: HGBA1C Urine Drug Screen: No results found for: LABOPIA, COCAINSCRNUR, LABBENZ, AMPHETMU, THCU, LABBARB  Alcohol Level No results found for: [place]  CT Head without contrast(Personally reviewed): CTH was negative for a large hypodensity concerning for a large territory infarct or hyperdensity concerning for an ICH  MR Angio head without contrast and Carotid Duplex BL: pending  MRI Brain(Personally reviewed): 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  Impression   Brittney Larsen is a 71 y.o. female with PMH significant for smoking, hypertension who presents with acute onset of vertigo, disequilibrium, facial numbness involving the entire face as well as some ringing sensation in her ear.  She was found to have subtle acute infarct in the right cerebellum in the MCA territory.  His symptoms are too mild with a NIH stroke scale of 1 and she was outside the [facility] window on arrival.  She is not a candidate for thrombectomy due to symptoms being too mild.  Will need stroke workup fopr further evaluation of etiology. This does not appear to be a small vessel stroke.  Primary Diagnosis:  Cerebral infarction, unspecified.  Secondary Diagnosis: Essential (primary) hypertension  Recommendations   Plan:  - Frequent Neuro checks per stroke unit protocol - Recommend Vascular imaging with MRA Angio Head without contrast and MR angio neck with and without contrast. - Recommend obtaining TTE - Recommend obtaining Lipid panel with LDL - Please start statin if LDL > 70 - Recommend HbA1c - Antithrombotic - Aspirin 81mg  daily along with plavix 75mg  daily for 21 days followed by Aspirin 81mg  daily alone. - Recommend DVT ppx - SBP goal - permissive hypertension first 24 h < 220/110. Held home meds.  - Recommend Telemetry monitoring for arrythmia - Recommend bedside swallow screen prior to PO intake. - Stroke education booklet - Recommend PT/OT/SLP consult - counseled her extensively on the importance of quitting smoking.  ______________________________________________________________________   Thank you for the opportunity to take part in the care of this patient. If you have any further questions, please contact the neurology consultation attending.  Signed,  [place] Pager Number [phone] _ _ _   _ __   _ __ _ _  __ __   _ __   __ _

## 2022-01-05 NOTE — ED Notes (Signed)
Pt returned from MRI °

## 2022-01-06 DIAGNOSIS — I10 Essential (primary) hypertension: Secondary | ICD-10-CM | POA: Diagnosis not present

## 2022-01-06 DIAGNOSIS — E1169 Type 2 diabetes mellitus with other specified complication: Secondary | ICD-10-CM | POA: Diagnosis not present

## 2022-01-06 DIAGNOSIS — Z720 Tobacco use: Secondary | ICD-10-CM | POA: Diagnosis not present

## 2022-01-06 DIAGNOSIS — G459 Transient cerebral ischemic attack, unspecified: Secondary | ICD-10-CM | POA: Diagnosis not present

## 2022-01-06 DIAGNOSIS — E669 Obesity, unspecified: Secondary | ICD-10-CM

## 2022-01-06 LAB — LIPID PANEL
Cholesterol: 168 mg/dL (ref 0–200)
HDL: 47 mg/dL (ref >40)
LDL Cholesterol: 96 mg/dL (ref 0–99)
Total CHOL/HDL Ratio: 3.6 RATIO
Triglycerides: 123 mg/dL (ref <150)
VLDL: 25 mg/dL (ref 0–40)

## 2022-01-06 LAB — HEMOGLOBIN A1C
Hgb A1c MFr Bld: 5.7 % — ABNORMAL HIGH (ref 4.8–5.6)
Mean Plasma Glucose: 116.89 mg/dL

## 2022-01-06 LAB — GLUCOSE, CAPILLARY
Glucose-Capillary: 114 mg/dL — ABNORMAL HIGH (ref 70–99)
Glucose-Capillary: 111 mg/dL — ABNORMAL HIGH (ref 70–99)

## 2022-01-06 MED ORDER — ALBUTEROL SULFATE HFA 108 (90 BASE) MCG/ACT IN AERS: 2.0000 | INHALATION_SPRAY | Freq: Four times a day (QID) | RESPIRATORY_TRACT | 0 refills | Status: AC | PRN

## 2022-01-06 MED ORDER — ASPIRIN 81 MG PO TBEC: 81.0000 mg | DELAYED_RELEASE_TABLET | Freq: Every day | ORAL | 1 refills | Status: AC

## 2022-01-06 MED ORDER — CLOPIDOGREL BISULFATE 75 MG PO TABS: 75.0000 mg | ORAL_TABLET | Freq: Every day | ORAL | 0 refills | Status: AC

## 2022-01-06 MED ORDER — ATORVASTATIN CALCIUM 40 MG PO TABS: 40.0000 mg | ORAL_TABLET | Freq: Every day | ORAL | 0 refills | Status: AC

## 2022-01-06 MED ORDER — PREDNISONE 20 MG PO TABS: 40.0000 mg | ORAL_TABLET | Freq: Every day | ORAL | 0 refills | Status: AC

## 2022-01-06 NOTE — Discharge Summary (Signed)
Physician Discharge Summary   Patient: Brittney Larsen MRN: [id number] DOB: September 28, 1951  Admit date:     01/04/2022  Discharge date: 01/06/22  Discharge Physician: Patrecia Pour   PCP: Pcp, No   Recommendations at discharge:  Follow up with neurology in 6-8 weeks Discharged on DAPT x3 weeks, then ASA 81mg  alone, restart home statin Follow up with PCP in 1-2 weeks. Given steroid burst for mild asthma exacerbation.  Discharge Diagnoses: Principal Problem:   TIA (transient ischemic attack) Active Problems:   Essential hypertension   Diabetes mellitus type 2 in obese ([place])   Tobacco use  Hospital Course: Brittney Larsen is a 71 y.o. female with a history of diet-controlled T2DM, HTN, CAD, tobacco use who presented with abrupt onset of dizziness and slurred speech that had resolved by the time of arrival to the ED. Head CT showed no acute intracranial pathology, though subsequent MRI revealed an acute right SCA territory cerebellar infarct. She was admitted for stroke work up. No therapy needs have been identified, and she will complete the work up with cardiac monitoring after discharge. Further details are below.    Assessment and Plan: Acute right SCA cerebellar CVA:  - PT/OT/SLP > no therapy or DME needs identified. - Echocardiogram without CES. No atrial fibrillation on cardiac monitoring during hospitalization, neurology, Dr. Reeves Forth, recommends outpatient cardiac monitoring. I reached out to [facility] who will arrange this.  - Stroke neurology formal recommendations pending.  - Continue DAPT x3 weeks, then aspirin alone. Needs neurology follow up in 6-8 weeks.  - Start back taking atorvastatin 40mg  daily. Without taking this previously, her LDL is 96.   Wheezing consistent with asthma exacerbation: No hypoxia or respiratory distress - Continue home prn albuterol - Give prednisone burst, needs this from time to time chronically.    Tobacco use:  - Cessation counseling  provided  HTN:  - Continue home med  T2DM: HbA1c 5.7%. No changes to home regimen planned  Overweight: Body mass index is 29.29 kg/m.   Consultants: Neurology Procedures performed: None  Disposition: Home Diet recommendation:  Cardiac and Carb modified diet  DISCHARGE MEDICATION: Allergies as of 01/06/2022       Reactions   Penicillins Itching        Medication List     TAKE these medications    albuterol 108 (90 Base) MCG/ACT inhaler Commonly known as: VENTOLIN HFA Inhale 2 puffs into the lungs every 6 (six) hours as needed for wheezing or shortness of breath.   ARIPiprazole 2 MG tablet Commonly known as: ABILIFY Take 2 mg by mouth 2 (two) times daily.   aspirin 81 MG EC tablet Take 1 tablet (81 mg total) by mouth daily. Swallow whole. Start taking on: January 07, 2022   atenolol 50 MG tablet Commonly known as: TENORMIN Take 50 mg by mouth daily.   atorvastatin 40 MG tablet Commonly known as: LIPITOR Take 1 tablet (40 mg total) by mouth daily.   cetirizine 10 MG tablet Commonly known as: ZYRTEC Take 10 mg by mouth daily as needed for allergies.   clopidogrel 75 MG tablet Commonly known as: PLAVIX Take 1 tablet (75 mg total) by mouth daily. Start taking on: January 07, 2022   famotidine 40 MG tablet Commonly known as: PEPCID Take 40 mg by mouth 2 (two) times daily.   lisinopril-hydrochlorothiazide 20-12.5 MG tablet Commonly known as: ZESTORETIC Take 1 tablet by mouth daily.   montelukast 10 MG tablet Commonly known as: SINGULAIR Take  10 mg by mouth at bedtime as needed (asthma,allergy).   omeprazole 40 MG capsule Commonly known as: PRILOSEC Take 40 mg by mouth daily.   predniSONE 20 MG tablet Commonly known as: DELTASONE Take 2 tablets (40 mg total) by mouth daily with breakfast for 5 days.   primidone 250 MG tablet Commonly known as: MYSOLINE Take 125 mg by mouth 2 (two) times daily.   VISINE DRY EYE RELIEF OP Place 1 drop into both eyes  daily as needed (dry eyes).        Follow-up Information     Nodal, Alphonzo Dublin, PA-C. Schedule an appointment as soon as possible for a visit.   Specialty: Physician Assistant Contact information: [place] [address] [phone]         [facility]. Schedule an appointment as soon as possible for a visit in 2 month(s).   Contact information: [address]     [address] [place] [address] [phone]               Feels well, no numbness, weakness, tingling, gait abnormality or speech changes. Wants to go home.   Discharge Exam: Filed Weights   01/04/22 2134  Weight: 68 kg  BP (!) 167/119 (BP Location: Left Arm)    Pulse 68    Temp 98.6 F (37 C) (Oral)    Resp 18    Ht 5' (1.524 m)    Wt 68 kg    SpO2 93%    BMI 29.29 kg/m   Gen: Well-appearing Pulm: Nonlabored, end-expiratory wheeze without expiratory prolongation CV: RRR Neuro: Alert, oriented, no focal deficits appreciated.    Condition at discharge: stable  The results of significant diagnostics from this hospitalization (including imaging, microbiology, ancillary and laboratory) are listed below for reference.   Imaging Studies: CT HEAD WO CONTRAST  Result Date: 01/04/2022 CLINICAL DATA:  slurred speech EXAM: CT HEAD WITHOUT CONTRAST TECHNIQUE: Contiguous axial images were obtained from the base of the skull through the vertex without intravenous contrast. RADIATION DOSE REDUCTION: This exam was performed according to the departmental dose-optimization program which includes automated exposure control, adjustment of the mA and/or kV according to patient size and/or use of iterative reconstruction technique. COMPARISON:  None. BRAIN: BRAIN Cerebral ventricle sizes are concordant with the degree of cerebral volume loss. Patchy and confluent areas of decreased attenuation are noted throughout the deep and periventricular white matter of the  cerebral hemispheres bilaterally, compatible with chronic microvascular ischemic disease. No evidence of large-territorial acute infarction. No parenchymal hemorrhage. No mass lesion. No extra-axial collection. No mass effect or midline shift. No hydrocephalus. Basilar cisterns are patent. Vascular: No hyperdense vessel. Skull: No acute fracture or focal lesion. Sinuses/Orbits: Paranasal sinuses and mastoid air cells are clear. The orbits are unremarkable. Other: None. IMPRESSION: No acute intracranial abnormality in a patient with chronic microvascular ischemic changes diffuse atrophy. Electronically Signed   By: Iven Finn M.D.   On: 01/04/2022 22:32   MR ANGIO HEAD WO CONTRAST  Result Date: 01/05/2022 CLINICAL DATA:  Acute neurologic deficit launch template MRA head MRA neck both EXAM: MRA NECK WITHOUT AND WITH CONTRAST MRA HEAD WITHOUT CONTRAST TECHNIQUE: Multiplanar and multiecho pulse sequences of the neck were obtained without and with intravenous contrast. Angiographic images of the neck were obtained using MRA technique without and with intravenous contrast; Angiographic images of the Circle of Willis were obtained using MRA technique without intravenous contrast. CONTRAST:  13mL GADAVIST  GADOBUTROL 1 MMOL/ML IV SOLN COMPARISON:  None. FINDINGS: MRA NECK FINDINGS Normal carotid and vertebral arteries allowing for motion obscuring the most proximal portions. MRA HEAD FINDINGS POSTERIOR CIRCULATION: --Vertebral arteries: Normal --Inferior cerebellar arteries: Normal. --Basilar artery: Normal. --Superior cerebellar arteries: Normal. --Posterior cerebral arteries: Normal. ANTERIOR CIRCULATION: --Intracranial internal carotid arteries: Normal. --Anterior cerebral arteries (ACA): Normal. --Middle cerebral arteries (MCA): Normal. ANATOMIC VARIANTS: None IMPRESSION: Normal MRA of the head and neck allowing for motion obscuring the most proximal cervical portions of the carotid and vertebral arteries.  Electronically Signed   By: Ulyses Jarred M.D.   On: 01/05/2022 21:45   MR ANGIO NECK W WO CONTRAST  Result Date: 01/05/2022 CLINICAL DATA:  Acute neurologic deficit launch template MRA head MRA neck both EXAM: MRA NECK WITHOUT AND WITH CONTRAST MRA HEAD WITHOUT CONTRAST TECHNIQUE: Multiplanar and multiecho pulse sequences of the neck were obtained without and with intravenous contrast. Angiographic images of the neck were obtained using MRA technique without and with intravenous contrast; Angiographic images of the Circle of Willis were obtained using MRA technique without intravenous contrast. CONTRAST:  56mL GADAVIST GADOBUTROL 1 MMOL/ML IV SOLN COMPARISON:  None. FINDINGS: MRA NECK FINDINGS Normal carotid and vertebral arteries allowing for motion obscuring the most proximal portions. MRA HEAD FINDINGS POSTERIOR CIRCULATION: --Vertebral arteries: Normal --Inferior cerebellar arteries: Normal. --Basilar artery: Normal. --Superior cerebellar arteries: Normal. --Posterior cerebral arteries: Normal. ANTERIOR CIRCULATION: --Intracranial internal carotid arteries: Normal. --Anterior cerebral arteries (ACA): Normal. --Middle cerebral arteries (MCA): Normal. ANATOMIC VARIANTS: None IMPRESSION: Normal MRA of the head and neck allowing for motion obscuring the most proximal cervical portions of the carotid and vertebral arteries. Electronically Signed   By: Ulyses Jarred M.D.   On: 01/05/2022 21:45   MR BRAIN WO CONTRAST  Result Date: 01/05/2022 CLINICAL DATA:  71 year old female with neurologic deficit. Slurred speech. TIA. EXAM: MRI HEAD WITHOUT CONTRAST TECHNIQUE: Multiplanar, multiecho pulse sequences of the brain and surrounding structures were obtained without intravenous contrast. COMPARISON:  Head CT yesterday.  Cervical spine MRI 03/06/2018. FINDINGS: Brain: Subtle abnormal diffusion in the right cerebellum SCA territory (series 5, image 76) with 2 more conspicuous small foci of restricted diffusion in the  posterosuperior cerebellum on that side (series 5, image 69). Faint if any associated T2 and FLAIR hyperintensity. No hemorrhage or mass effect. Small contralateral left cerebellar chronic lacunar infarct on series 10, image 8. No other diffusion restriction. Patchy and confluent bilateral abnormal cerebral white matter T2 and FLAIR hyperintensity which in the bilateral corona radiata and centrum semiovale most resembles chronic white matter lacunar infarcts. No cerebral cortical encephalomalacia or chronic cerebral blood products identified. No midline shift, mass effect, evidence of mass lesion, ventriculomegaly, extra-axial collection or acute intracranial hemorrhage. Cervicomedullary junction and pituitary are within normal limits. Deep gray nuclei and brainstem appear spared. Vascular: Major intracranial vascular flow voids are preserved. Skull and upper cervical spine: Negative for age visible cervical spine. Visualized bone marrow signal is within normal limits. Sinuses/Orbits: Negative orbits. Paranasal Visualized paranasal sinuses and mastoids are stable and well aerated. Other: Grossly normal visible internal auditory structures. Negative visible scalp and face. IMPRESSION: 1. Subtle Acute Infarct of the right cerebellum SCA territory. No associated hemorrhage or mass effect. 2. Underlying chronic small vessel disease most pronounced in the bilateral cerebral white matter, also with mild left cerebellar involvement. Electronically Signed   By: Genevie Ann M.D.   On: 01/05/2022 06:23   ECHOCARDIOGRAM COMPLETE  Result Date: 01/05/2022    ECHOCARDIOGRAM REPORT  Patient Name:   Rodney Booze Date of Exam: 01/05/2022 Medical Rec #:  [id number]           Height:       60.0 in Accession #:    [id number]          Weight:       150.0 lb Date of Birth:  [birth year]          BSA:          1.652 m Patient Age:    31 years            BP:           202/106 mmHg Patient Gender: F                   HR:           60  bpm. Exam Location:  Inpatient Procedure: 2D Echo, 3D Echo, Color Doppler and Cardiac Doppler Indications:    TIA G45.9  History:        Patient has no prior history of Echocardiogram examinations.                 CAD; Risk Factors:Diabetes and Hypertension.  Sonographer:    Darlina Sicilian RDCS Referring Phys: [id number] [place]  1. Left ventricular ejection fraction, by estimation, is 50 to 55%. Left ventricular ejection fraction by 3D volume is 50 %. The left ventricle has low normal function. The left ventricle has no regional wall motion abnormalities. Left ventricular diastolic parameters are indeterminate.  2. Right ventricular systolic function is normal. The right ventricular size is normal.  3. The mitral valve is normal in structure. Mild mitral valve regurgitation.  4. The aortic valve is grossly normal. Aortic valve regurgitation is not visualized. FINDINGS  Left Ventricle: Left ventricular ejection fraction, by estimation, is 50 to 55%. Left ventricular ejection fraction by 3D volume is 50 %. The left ventricle has low normal function. The left ventricle has no regional wall motion abnormalities. The left ventricular internal cavity size was normal in size. There is no left ventricular hypertrophy. Left ventricular diastolic parameters are indeterminate. Right Ventricle: The right ventricular size is normal. Right vetricular wall thickness was not well visualized. Right ventricular systolic function is normal. Left Atrium: Left atrial size was normal in size. Right Atrium: Right atrial size was normal in size. Pericardium: There is no evidence of pericardial effusion. Mitral Valve: The mitral valve is normal in structure. Mild mitral valve regurgitation. Tricuspid Valve: The tricuspid valve is grossly normal. Tricuspid valve regurgitation is not demonstrated. Aortic Valve: The aortic valve is grossly normal. Aortic valve regurgitation is not visualized. Pulmonic Valve: The pulmonic  valve was normal in structure. Pulmonic valve regurgitation is not visualized. Aorta: The aortic root and ascending aorta are structurally normal, with no evidence of dilitation. IAS/Shunts: The atrial septum is grossly normal.  LEFT VENTRICLE PLAX 2D LVIDd:         4.70 cm         Diastology LVIDs:         3.70 cm         LV e' medial:    4.62 cm/s LV PW:         1.00 cm         LV E/e' medial:  17.7 LV IVS:        1.10 cm         LV e' lateral:   6.16  cm/s LVOT diam:     2.00 cm         LV E/e' lateral: 13.3 LV SV:         58 LV SV Index:   35 LVOT Area:     3.14 cm        3D Volume EF                                LV 3D EF:    Left                                             ventricul                                             ar                                             ejection                                             fraction                                             by 3D                                             volume is                                             50 %.                                 3D Volume EF:                                3D EF:        50 %                                LV EDV:       114 ml                                LV ESV:       57 ml  LV SV:        57 ml RIGHT VENTRICLE RV S prime:     10.50 cm/s TAPSE (M-mode): 1.8 cm LEFT ATRIUM             Index        RIGHT ATRIUM           Index LA diam:        3.70 cm 2.24 cm/m   RA Area:     11.70 cm LA Vol (A2C):   42.1 ml 25.49 ml/m  RA Volume:   24.00 ml  14.53 ml/m LA Vol (A4C):   48.2 ml 29.18 ml/m LA Biplane Vol: 48.2 ml 29.18 ml/m  AORTIC VALVE LVOT Vmax:   82.20 cm/s LVOT Vmean:  59.900 cm/s LVOT VTI:    0.185 m  AORTA Ao Root diam: 2.90 cm MITRAL VALVE MV Area (PHT): 4.86 cm    SHUNTS MV Decel Time: 156 msec    Systemic VTI:  0.18 m MV E velocity: 81.80 cm/s  Systemic Diam: 2.00 cm MV A velocity: 99.60 cm/s MV E/A ratio:  0.82 Mertie Moores MD Electronically signed by Mertie Moores MD Signature Date/Time: 01/05/2022/3:27:58 PM    Final    VAS US CAROTID (at [facility] and [facility] only)  Result Date: 01/05/2022 Carotid Arterial Duplex Study Patient Name:  Rodney Booze  Date of Exam:   01/05/2022 Medical Rec #: [id number]            Accession #:    [id number] Date of Birth: November 14, 1950           Patient Gender: F Patient Age:   87 years Exam Location:  [facility] Procedure:      VAS US CAROTID Referring Phys: Harrold Donath --------------------------------------------------------------------------------  Indications:      TIA. Risk Factors:     Hypertension, Diabetes, current smoker, coronary artery                   disease. Comparison Study: No previous exams Performing Technologist: Jody Hill RVT, RDMS  Examination Guidelines: A complete evaluation includes B-mode imaging, spectral Doppler, color Doppler, and power Doppler as needed of all accessible portions of each vessel. Bilateral testing is considered an integral part of a complete examination. Limited examinations for reoccurring indications may be performed as noted.  Right Carotid Findings: +----------+--------+--------+--------+---------------------+------------------+             PSV cm/s EDV cm/s Stenosis Plaque Description    Comments            +----------+--------+--------+--------+---------------------+------------------+  CCA Prox   41       11                                                          +----------+--------+--------+--------+---------------------+------------------+  CCA Distal 48       14                                                          +----------+--------+--------+--------+---------------------+------------------+  ICA Prox   44       12  smooth and                                                                       homogeneous                               +----------+--------+--------+--------+---------------------+------------------+  ICA Distal 87       32                                                           +----------+--------+--------+--------+---------------------+------------------+  ECA        54       9                                       intimal thickening  +----------+--------+--------+--------+---------------------+------------------+ +----------+--------+-------+----------------+-------------------+             PSV cm/s EDV cms Describe         Arm Pressure (mmHG)  +----------+--------+-------+----------------+-------------------+  Subclavian 93               Multiphasic, WNL                      +----------+--------+-------+----------------+-------------------+ +---------+--------+--+--------+--+---------+  Vertebral PSV cm/s 50 EDV cm/s 15 Antegrade  +---------+--------+--+--------+--+---------+  Left Carotid Findings: +----------+--------+--------+--------+------------------+------------------+             PSV cm/s EDV cm/s Stenosis Plaque Description Comments            +----------+--------+--------+--------+------------------+------------------+  CCA Prox   74       16                                                       +----------+--------+--------+--------+------------------+------------------+  CCA Distal 55       15                                   intimal thickening  +----------+--------+--------+--------+------------------+------------------+  ICA Prox   35       10                                   intimal thickening  +----------+--------+--------+--------+------------------+------------------+  ICA Distal 58       22                                                       +----------+--------+--------+--------+------------------+------------------+  ECA        51  8                                                        +----------+--------+--------+--------+------------------+------------------+ +----------+--------+--------+----------------+-------------------+             PSV cm/s EDV cm/s Describe         Arm Pressure (mmHG)   +----------+--------+--------+----------------+-------------------+  Subclavian 76                Multiphasic, WNL                      +----------+--------+--------+----------------+-------------------+ +---------+--------+--+--------+--+---------+  Vertebral PSV cm/s 47 EDV cm/s 13 Antegrade  +---------+--------+--+--------+--+---------+   Summary: Right Carotid: The extracranial vessels were near-normal with only minimal wall                thickening or plaque. Left Carotid: The extracranial vessels were near-normal with only minimal wall               thickening or plaque. Vertebrals:  Bilateral vertebral arteries demonstrate antegrade flow. Subclavians: Normal flow hemodynamics were seen in bilateral subclavian              arteries. *See table(s) above for measurements and observations.  Electronically signed by Antony Contras MD on 01/05/2022 at 3:55:07 PM.    Final     Microbiology: Results for orders placed or performed during the hospital encounter of 01/04/22  Resp Panel by RT-PCR (Flu A&B, Covid) Nasopharyngeal Swab     Status: None   Collection Time: 01/04/22 10:00 PM   Specimen: Nasopharyngeal Swab; Nasopharyngeal(NP) swabs in vial transport medium  Result Value Ref Range Status   SARS Coronavirus 2 by RT PCR NEGATIVE NEGATIVE Final    Comment: (NOTE) SARS-CoV-2 target nucleic acids are NOT DETECTED.  The SARS-CoV-2 RNA is generally detectable in upper respiratory specimens during the acute phase of infection. The lowest concentration of SARS-CoV-2 viral copies this assay can detect is 138 copies/mL. A negative result does not preclude SARS-Cov-2 infection and should not be used as the sole basis for treatment or other patient management decisions. A negative result may occur with  improper specimen collection/handling, submission of specimen other than nasopharyngeal swab, presence of viral mutation(s) within the areas targeted by this assay, and inadequate number of  viral copies(<138 copies/mL). A negative result must be combined with clinical observations, patient history, and epidemiological information. The expected result is Negative.  Fact Sheet for Patients:  EntrepreneurPulse.com.au  Fact Sheet for Healthcare Providers:  IncredibleEmployment.be  This test is no t yet approved or cleared by the [place] FDA and  has been authorized for detection and/or diagnosis of SARS-CoV-2 by FDA under an Emergency Use Authorization (EUA). This EUA will remain  in effect (meaning this test can be used) for the duration of the COVID-19 declaration under Section 564(b)(1) of the Act, 21 U.S.C.section 360bbb-3(b)(1), unless the authorization is terminated  or revoked sooner.       Influenza A by PCR NEGATIVE NEGATIVE Final   Influenza B by PCR NEGATIVE NEGATIVE Final    Comment: (NOTE) The Xpert Xpress SARS-CoV-2/FLU/RSV plus assay is intended as an aid in the diagnosis of influenza from Nasopharyngeal swab specimens and should not be used as a sole basis for treatment. Nasal washings and aspirates are unacceptable for Xpert  Xpress SARS-CoV-2/FLU/RSV testing.  Fact Sheet for Patients: EntrepreneurPulse.com.au  Fact Sheet for Healthcare Providers: IncredibleEmployment.be  This test is not yet approved or cleared by the [place] FDA and has been authorized for detection and/or diagnosis of SARS-CoV-2 by FDA under an Emergency Use Authorization (EUA). This EUA will remain in effect (meaning this test can be used) for the duration of the COVID-19 declaration under Section 564(b)(1) of the Act, 21 U.S.C. section 360bbb-3(b)(1), unless the authorization is terminated or revoked.  Performed at [facility], [place] [address], [place], [place] [address]     Labs: CBC: Recent Labs  Lab 01/04/22 2202 01/04/22 2305  WBC 6.7  --   NEUTROABS 4.3  --   HGB 14.9  15.0  HCT 45.0 44.0  MCV 84.4  --   PLT 256  --    Basic Metabolic Panel: Recent Labs  Lab 01/04/22 2202 01/04/22 2305  NA 139 141  K 3.5 3.5  CL 104 102  CO2 27  --   GLUCOSE 179* 176*  BUN 13 17  CREATININE 0.96 0.80  CALCIUM 9.2  --    Liver Function Tests: Recent Labs  Lab 01/04/22 2202  AST 23  ALT 23  ALKPHOS 58  BILITOT 0.7  PROT 6.9  ALBUMIN 3.7   CBG: Recent Labs  Lab 01/05/22 1132 01/05/22 1647 01/05/22 2150 01/06/22 0626 01/06/22 1230  GLUCAP 104* 87 102* 114* 111*    Discharge time spent: greater than 30 minutes.  Signed: Patrecia Pour, MD [facility] 01/06/2022

## 2022-01-06 NOTE — Progress Notes (Addendum)
STROKE TEAM PROGRESS NOTE   INTERVAL HISTORY No family at the bedside, husband is on the phone. Plan for discharge today.  Reports she had a long day and spent most of her day out mulching. In the evening around 2000, she was going back to her closet when she had sudden onset, feeling very woozy, walking like she is drunk, mild R sided headache and everything felt spinning and her face felt like she has a mask on. Symptoms have resolved.  Vitals:   01/05/22 2332 01/06/22 0351 01/06/22 0725 01/06/22 1103  BP: (!) 173/81 (!) 179/94 (!) 158/98 (!) 167/119  Pulse: (!) 54 64 69 68  Resp: 19 18 18 18   Temp: 98.1 F (36.7 C) 98.4 F (36.9 C) 98.6 F (37 C) 98.6 F (37 C)  TempSrc: Oral Oral Oral Oral  SpO2: 94% 92% 97% 93%  Weight:      Height:       CBC:  Recent Labs  Lab 01/04/22 2202 01/04/22 2305  WBC 6.7  --   NEUTROABS 4.3  --   HGB 14.9 15.0  HCT 45.0 44.0  MCV 84.4  --   PLT 256  --    Basic Metabolic Panel:  Recent Labs  Lab 01/04/22 2202 01/04/22 2305  NA 139 141  K 3.5 3.5  CL 104 102  CO2 27  --   GLUCOSE 179* 176*  BUN 13 17  CREATININE 0.96 0.80  CALCIUM 9.2  --    Lipid Panel:  Recent Labs  Lab 01/06/22 0243  CHOL 168  TRIG 123  HDL 47  CHOLHDL 3.6  VLDL 25  LDLCALC 96   HgbA1c:  Recent Labs  Lab 01/06/22 0243  HGBA1C 5.7*   Urine Drug Screen: No results for input(s): LABOPIA, COCAINSCRNUR, LABBENZ, AMPHETMU, THCU, LABBARB in the last 168 hours.  Alcohol Level No results for input(s): ETH in the last 168 hours.  IMAGING past 24 hours MR ANGIO HEAD WO CONTRAST  Result Date: 01/05/2022 CLINICAL DATA:  Acute neurologic deficit launch template MRA head MRA neck both EXAM: MRA NECK WITHOUT AND WITH CONTRAST MRA HEAD WITHOUT CONTRAST TECHNIQUE: Multiplanar and multiecho pulse sequences of the neck were obtained without and with intravenous contrast. Angiographic images of the neck were obtained using MRA technique without and with intravenous  contrast; Angiographic images of the Circle of Willis were obtained using MRA technique without intravenous contrast. CONTRAST:  39mL GADAVIST GADOBUTROL 1 MMOL/ML IV SOLN COMPARISON:  None. FINDINGS: MRA NECK FINDINGS Normal carotid and vertebral arteries allowing for motion obscuring the most proximal portions. MRA HEAD FINDINGS POSTERIOR CIRCULATION: --Vertebral arteries: Normal --Inferior cerebellar arteries: Normal. --Basilar artery: Normal. --Superior cerebellar arteries: Normal. --Posterior cerebral arteries: Normal. ANTERIOR CIRCULATION: --Intracranial internal carotid arteries: Normal. --Anterior cerebral arteries (ACA): Normal. --Middle cerebral arteries (MCA): Normal. ANATOMIC VARIANTS: None IMPRESSION: Normal MRA of the head and neck allowing for motion obscuring the most proximal cervical portions of the carotid and vertebral arteries. Electronically Signed   By: Ulyses Jarred M.D.   On: 01/05/2022 21:45   MR ANGIO NECK W WO CONTRAST  Result Date: 01/05/2022 CLINICAL DATA:  Acute neurologic deficit launch template MRA head MRA neck both EXAM: MRA NECK WITHOUT AND WITH CONTRAST MRA HEAD WITHOUT CONTRAST TECHNIQUE: Multiplanar and multiecho pulse sequences of the neck were obtained without and with intravenous contrast. Angiographic images of the neck were obtained using MRA technique without and with intravenous contrast; Angiographic images of the Circle of Willis were obtained using  MRA technique without intravenous contrast. CONTRAST:  38mL GADAVIST GADOBUTROL 1 MMOL/ML IV SOLN COMPARISON:  None. FINDINGS: MRA NECK FINDINGS Normal carotid and vertebral arteries allowing for motion obscuring the most proximal portions. MRA HEAD FINDINGS POSTERIOR CIRCULATION: --Vertebral arteries: Normal --Inferior cerebellar arteries: Normal. --Basilar artery: Normal. --Superior cerebellar arteries: Normal. --Posterior cerebral arteries: Normal. ANTERIOR CIRCULATION: --Intracranial internal carotid arteries: Normal.  --Anterior cerebral arteries (ACA): Normal. --Middle cerebral arteries (MCA): Normal. ANATOMIC VARIANTS: None IMPRESSION: Normal MRA of the head and neck allowing for motion obscuring the most proximal cervical portions of the carotid and vertebral arteries. Electronically Signed   By: Ulyses Jarred M.D.   On: 01/05/2022 21:45   ECHOCARDIOGRAM COMPLETE  Result Date: 01/05/2022    ECHOCARDIOGRAM REPORT   Patient Name:   Brittney Larsen Date of Exam: 01/05/2022 Medical Rec #:  [id number]           Height:       60.0 in Accession #:    [id number]          Weight:       150.0 lb Date of Birth:  26-Feb-1951          BSA:          1.652 m Patient Age:    71 years            BP:           202/106 mmHg Patient Gender: F                   HR:           60 bpm. Exam Location:  Inpatient Procedure: 2D Echo, 3D Echo, Color Doppler and Cardiac Doppler Indications:    TIA G45.9  History:        Patient has no prior history of Echocardiogram examinations.                 CAD; Risk Factors:Diabetes and Hypertension.  Sonographer:    Darlina Sicilian RDCS Referring Phys: [id number] [place]  1. Left ventricular ejection fraction, by estimation, is 50 to 55%. Left ventricular ejection fraction by 3D volume is 50 %. The left ventricle has low normal function. The left ventricle has no regional wall motion abnormalities. Left ventricular diastolic parameters are indeterminate.  2. Right ventricular systolic function is normal. The right ventricular size is normal.  3. The mitral valve is normal in structure. Mild mitral valve regurgitation.  4. The aortic valve is grossly normal. Aortic valve regurgitation is not visualized. FINDINGS  Left Ventricle: Left ventricular ejection fraction, by estimation, is 50 to 55%. Left ventricular ejection fraction by 3D volume is 50 %. The left ventricle has low normal function. The left ventricle has no regional wall motion abnormalities. The left ventricular internal cavity size  was normal in size. There is no left ventricular hypertrophy. Left ventricular diastolic parameters are indeterminate. Right Ventricle: The right ventricular size is normal. Right vetricular wall thickness was not well visualized. Right ventricular systolic function is normal. Left Atrium: Left atrial size was normal in size. Right Atrium: Right atrial size was normal in size. Pericardium: There is no evidence of pericardial effusion. Mitral Valve: The mitral valve is normal in structure. Mild mitral valve regurgitation. Tricuspid Valve: The tricuspid valve is grossly normal. Tricuspid valve regurgitation is not demonstrated. Aortic Valve: The aortic valve is grossly normal. Aortic valve regurgitation is not visualized. Pulmonic Valve: The pulmonic valve was normal in structure. Pulmonic  valve regurgitation is not visualized. Aorta: The aortic root and ascending aorta are structurally normal, with no evidence of dilitation. IAS/Shunts: The atrial septum is grossly normal.  LEFT VENTRICLE PLAX 2D LVIDd:         4.70 cm         Diastology LVIDs:         3.70 cm         LV e' medial:    4.62 cm/s LV PW:         1.00 cm         LV E/e' medial:  17.7 LV IVS:        1.10 cm         LV e' lateral:   6.16 cm/s LVOT diam:     2.00 cm         LV E/e' lateral: 13.3 LV SV:         58 LV SV Index:   35 LVOT Area:     3.14 cm        3D Volume EF                                LV 3D EF:    Left                                             ventricul                                             ar                                             ejection                                             fraction                                             by 3D                                             volume is                                             50 %.                                 3D Volume EF:  3D EF:        50 %                                LV EDV:       114 ml                                LV  ESV:       57 ml                                LV SV:        57 ml RIGHT VENTRICLE RV S prime:     10.50 cm/s TAPSE (M-mode): 1.8 cm LEFT ATRIUM             Index        RIGHT ATRIUM           Index LA diam:        3.70 cm 2.24 cm/m   RA Area:     11.70 cm LA Vol (A2C):   42.1 ml 25.49 ml/m  RA Volume:   24.00 ml  14.53 ml/m LA Vol (A4C):   48.2 ml 29.18 ml/m LA Biplane Vol: 48.2 ml 29.18 ml/m  AORTIC VALVE LVOT Vmax:   82.20 cm/s LVOT Vmean:  59.900 cm/s LVOT VTI:    0.185 m  AORTA Ao Root diam: 2.90 cm MITRAL VALVE MV Area (PHT): 4.86 cm    SHUNTS MV Decel Time: 156 msec    Systemic VTI:  0.18 m MV E velocity: 81.80 cm/s  Systemic Diam: 2.00 cm MV A velocity: 99.60 cm/s MV E/A ratio:  0.82 Mertie Moores MD Electronically signed by Mertie Moores MD Signature Date/Time: 01/05/2022/3:27:58 PM    Final    VAS US CAROTID (at [facility] and [facility] only)  Result Date: 01/05/2022 Carotid Arterial Duplex Study Patient Name:  Brittney Larsen  Date of Exam:   01/05/2022 Medical Rec #: [id number]            Accession #:    [id number] Date of Birth: 05/19/1951           Patient Gender: F Patient Age:   71 years Exam Location:  [facility] Procedure:      VAS US CAROTID Referring Phys: Harrold Donath --------------------------------------------------------------------------------  Indications:      TIA. Risk Factors:     Hypertension, Diabetes, current smoker, coronary artery                   disease. Comparison Study: No previous exams Performing Technologist: Jody Hill RVT, RDMS  Examination Guidelines: A complete evaluation includes B-mode imaging, spectral Doppler, color Doppler, and power Doppler as needed of all accessible portions of each vessel. Bilateral testing is considered an integral part of a complete examination. Limited examinations for reoccurring indications may be performed as noted.  Right Carotid Findings: +----------+--------+--------+--------+---------------------+------------------+              PSV cm/s EDV cm/s Stenosis Plaque Description    Comments            +----------+--------+--------+--------+---------------------+------------------+  CCA Prox   41       11                                                          +----------+--------+--------+--------+---------------------+------------------+  CCA Distal 48       14                                                          +----------+--------+--------+--------+---------------------+------------------+  ICA Prox   44       12                smooth and                                                                       homogeneous                               +----------+--------+--------+--------+---------------------+------------------+  ICA Distal 87       32                                                          +----------+--------+--------+--------+---------------------+------------------+  ECA        54       9                                       intimal thickening  +----------+--------+--------+--------+---------------------+------------------+ +----------+--------+-------+----------------+-------------------+             PSV cm/s EDV cms Describe         Arm Pressure (mmHG)  +----------+--------+-------+----------------+-------------------+  Subclavian 93               Multiphasic, WNL                      +----------+--------+-------+----------------+-------------------+ +---------+--------+--+--------+--+---------+  Vertebral PSV cm/s 50 EDV cm/s 15 Antegrade  +---------+--------+--+--------+--+---------+  Left Carotid Findings: +----------+--------+--------+--------+------------------+------------------+             PSV cm/s EDV cm/s Stenosis Plaque Description Comments            +----------+--------+--------+--------+------------------+------------------+  CCA Prox   74       16                                                       +----------+--------+--------+--------+------------------+------------------+  CCA Distal 55        15                                   intimal thickening  +----------+--------+--------+--------+------------------+------------------+  ICA Prox   35       10  intimal thickening  +----------+--------+--------+--------+------------------+------------------+  ICA Distal 58       22                                                       +----------+--------+--------+--------+------------------+------------------+  ECA        51       8                                                        +----------+--------+--------+--------+------------------+------------------+ +----------+--------+--------+----------------+-------------------+             PSV cm/s EDV cm/s Describe         Arm Pressure (mmHG)  +----------+--------+--------+----------------+-------------------+  Subclavian 76                Multiphasic, WNL                      +----------+--------+--------+----------------+-------------------+ +---------+--------+--+--------+--+---------+  Vertebral PSV cm/s 47 EDV cm/s 13 Antegrade  +---------+--------+--+--------+--+---------+   Summary: Right Carotid: The extracranial vessels were near-normal with only minimal wall                thickening or plaque. Left Carotid: The extracranial vessels were near-normal with only minimal wall               thickening or plaque. Vertebrals:  Bilateral vertebral arteries demonstrate antegrade flow. Subclavians: Normal flow hemodynamics were seen in bilateral subclavian              arteries. *See table(s) above for measurements and observations.  Electronically signed by Antony Contras MD on 01/05/2022 at 3:55:07 PM.    Final     PHYSICAL EXAM  Physical Exam  Constitutional: Appears well-developed and well-nourished.  Psych: Affect appropriate to situation Eyes: No scleral injection HENT: No OP obstrucion MSK: no joint deformities.  Cardiovascular: Normal rate and regular rhythm.  Respiratory: Effort normal, non-labored breathing GI:  Soft.  No distension. There is no tenderness.  Skin: WDI  Neuro: Mental Status: Patient is awake, alert, oriented to person, place, month, year, and situation. Patient is able to give a clear and coherent history. No signs of aphasia or neglect Cranial Nerves: II: Visual Fields are full. Pupils are equal, round, and reactive to light.   III,IV, VI: EOMI without ptosis or diploplia.  V: Facial sensation is symmetric to temperature VII: Facial movement is symmetric resting and smiling VIII: Hearing is intact to voice X: Palate elevates symmetrically XI: Shoulder shrug is symmetric. XII: Tongue protrudes midline without atrophy or fasciculations.  Motor: Tone is normal. Bulk is normal. 5/5 strength was present in all four extremities.  Sensory: Sensation is symmetric to light touch and temperature in the arms and legs. No extinction to DSS present.  Cerebellar: FNF and HKS are intact bilaterally   ASSESSMENT/PLAN Ms. Brittney Larsen is a 71 y.o. female with history of  T2DM, HTN, CAD, tobacco presenting with dizziness, difficulty ambulating, feeling very off balance and vertigo along with loud noise on the right and slurred speech. Baseline benign tremor in bilateral upper extremities.   Outpatient 30 day heart monitor recommended  TIA vs syncopal  episode  Code Stroke CT head No acute abnormality. ASPECTS 10.    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 Underlying chronic small vessel disease most pronounced in the bilateral cerebral white matter MRA  Normal MRA of the head and neck allowing for motion obscuring the most proximal cervical portions of the carotid and vertebral arteries. Carotid Doppler  near normal vessels with only mild thickening or plaque 2D Echo EF 50-55%  LDL 96 HgbA1c 5.7 VTE prophylaxis - SCDs    Diet   Diet heart healthy/carb modified Room service appropriate? Yes; Fluid consistency: Thin   No antithrombotic prior to  admission, now on aspirin 81 mg daily and clopidogrel 75 mg daily.  Therapy recommendations:  No follow up Disposition:  Discharge home  Hypertension Home meds:  Lisinopril Stable Permissive hypertension (OK if < 220/120) but gradually normalize in 5-7 days Long-term BP goal normotensive  Hyperlipidemia LDL 96, goal < 70 Add Atorvastatin 40mg   Continue statin at discharge   Other Stroke Risk Factors Advanced Age >/= 50  Cigarette smoker, advised to stop smoking Coronary artery disease   Other Active Problems Wheezing consistent with asthma exacerbation: No hypoxia or respiratory distress  Hospital day # 0  Patient seen and examined by NP/APP with MD. MD to update note as needed.   Janine Ores, DNP, FNP-BC [facility] Pager: [phone]  ATTENDING ATTESTATION:  71 year old history of hypertension coronary disease without all day mulching for long period.  She felt dizzy and had right facial numbness.  Not eligible for TNK as she was outside the window and low NIH stroke scale.  Subacute infarct in the right cerebellum SCA territory.  MRA is negative for stenosis or thrombus.  Echo shows EF of 55%, no thrombus. She will need DAPT therapy for 21 days and then aspirin alone 81 mg.  Follow-up in stroke clinic.  30-day monitor for A-fib.  Discussed plan with primary  Dr. Reeves Forth evaluated pt independently, reviewed imaging, chart, labs. Discussed and formulated plan with the APP. Please see APP note above for details.   Total 36 minutes spent on counseling patient and coordinating care, writing notes and reviewing chart.   Griff Badley,MD    To contact Stroke Continuity provider, please refer to [email or web address] After hours, contact General Neurology

## 2022-01-06 NOTE — Care Management Obs Status (Signed)
MEDICARE OBSERVATION STATUS NOTIFICATION ? ? ?Patient Details  ?Name: Brittney Larsen ?MRN: [id number] ?Date of Birth: [birth year] ? ? ?Medicare Observation Status Notification Given:  Yes ? ? ? ?Pollie Friar, RN ?01/06/2022, 11:20 AM ?

## 2022-01-06 NOTE — TOC Transition Note (Addendum)
Transition of Care (TOC) - CM/SW Discharge Note ? ? ?Patient Details  ?Name: Brittney Larsen ?MRN: [id number] ?Date of Birth: 02-19-1951 ? ?Transition of Care (TOC) CM/SW Contact:  ?Pollie Friar, RN ?Phone Number: ?01/06/2022, 11:21 AM ? ? ?Clinical Narrative:    ?PCP: Dr Wendie Chess ?Patient is discharging home with self care. No needs per PT/OT and no DME needs.  ?Pt denies any issue with home medications. She has needed transportation at home and to home today.  ? ? ?Final next level of care: Home/Self Care ?Barriers to Discharge: No Barriers Identified ? ? ?Patient Goals and CMS Choice ?  ?  ?  ? ?Discharge Placement ?  ?           ?  ?  ?  ?  ? ?Discharge Plan and Services ?  ?  ?           ?  ?  ?  ?  ?  ?  ?  ?  ?  ?  ? ?Social Determinants of Health (SDOH) Interventions ?  ? ? ?Readmission Risk Interventions ?No flowsheet data found. ? ? ? ? ?

## 2022-01-06 NOTE — Evaluation (Signed)
Speech Language Pathology Evaluation ?Patient Details ?Name: DEDRA MATSUO ?MRN: [id number] ?DOB: Feb 28, 1951 ?Today's Date: 01/06/2022 ?Time: 7782-4235 ?SLP Time Calculation (min) (ACUTE ONLY): 15 min ? ?Problem List:  ?Patient Active Problem List  ? Diagnosis Date Noted  ? TIA (transient ischemic attack) 01/05/2022  ? Essential hypertension 01/05/2022  ? Diabetes mellitus type 2 in obese ([place]) 01/05/2022  ? Tobacco use 01/05/2022  ? ?Past Medical History:  ?Past Medical History:  ?Diagnosis Date  ? Asthma   ? Coronary artery disease   ? Diabetes mellitus without complication ([place])   ? Hypertension   ? ?Past Surgical History:  ?Past Surgical History:  ?Procedure Laterality Date  ? APPENDECTOMY    ? ?HPI:  ?71 y.o. female with a history of diet-controlled T2DM, HTN, CAD, tobacco use who presented with abrupt onset of dizziness and slurred speech that had resolved by the time of arrival to the ED. Head CT showed no acute intracranial pathology, though subsequent MRI revealed an acute right SCA territory cerebellar infarct.  ? ?Assessment / Plan / Recommendation ?Clinical Impression ? Ms. Balik was seen for evaluation of cognitive-linguistic skills. The patient denied any baseline deficits in the aforementioned areas and reported that she has not had acute changes in her speech/thinking. The SLUMS was administered to evaluate cognitive-linguistic skills. Patient scored 21/30 which indicates a mild cognitive deficit. Areas of deficit included problem-solving and decreased short-term memory recall. Of note, patient had increased difficulty with math problem, and repeating numbers backwards. The patient reports that she independently manages her medications and that her husband manages financials. SLP educated patient on continuing to have her husband oversee financials at d/c. SLP to sign off as education completed and patient reports returning prior level of cognitive function. ?   ?SLP Assessment ? SLP  Recommendation/Assessment: Patient does not need any further [facility] ?SLP Visit Diagnosis: Cognitive communication deficit (R41.841)  ?  ?Recommendations for follow up therapy are one component of a multi-disciplinary discharge planning process, led by the attending physician.  Recommendations may be updated based on patient status, additional functional criteria and insurance authorization. ?   ?Follow Up Recommendations ? No SLP follow up  ?  ?Assistance Recommended at Discharge ? PRN  ?Functional Status Assessment Patient has had a recent decline in their functional status and demonstrates the ability to make significant improvements in function in a reasonable and predictable amount of time.  ?Frequency and Duration    ?  ?  ?   ?SLP Evaluation ?Cognition ? Overall Cognitive Status: Impaired/Different from baseline ?Arousal/Alertness: Awake/alert ?Orientation Level: Oriented X4 ?Year: 2023 ?Day of Week: Correct ?Attention: Focused ?Focused Attention: Appears intact ?Memory: Impaired ?Memory Impairment: Storage deficit;Decreased recall of new information ?Awareness: Appears intact ?Problem Solving: Impaired ?Problem Solving Impairment: Verbal basic ?Safety/Judgment: Appears intact  ?  ?   ?Comprehension ? Auditory Comprehension ?Overall Auditory Comprehension: Appears within functional limits for tasks assessed ?Yes/No Questions: Not tested ?Commands: Not tested ?Conversation: Simple ?EffectiveTechniques: Repetition ?Visual Recognition/Discrimination ?Discrimination: Not tested ?Reading Comprehension ?Reading Status: Not tested  ?  ?Expression Expression ?Primary Mode of Expression: Verbal ?Verbal Expression ?Overall Verbal Expression: Appears within functional limits for tasks assessed ?Initiation: No impairment ?Automatic Speech: Name;Social Response ?Level of Generative/Spontaneous Verbalization: Conversation ?Repetition:  (NT) ?Naming: No impairment ?Pragmatics: No  impairment ?Effective Techniques: Open ended questions;Sentence completion ?Non-Verbal Means of Communication: Not applicable ?Written Expression ?Dominant Hand: Right ?Written Expression: Not tested   ?Oral / Motor ? Motor Speech ?Overall Motor Speech: Appears  within functional limits for tasks assessed ?Respiration: Within functional limits ?Phonation: Normal ?Resonance: Within functional limits ?Articulation: Within functional limitis ?Intelligibility: Intelligible ?Motor Planning: Witnin functional limits ?Motor Speech Errors: Not applicable   ?        ? ?Vaughan Sine ?01/06/2022, 9:49 AM ? ?

## 2023-12-05 DIAGNOSIS — I4892 Unspecified atrial flutter: Secondary | ICD-10-CM | POA: Diagnosis not present

## 2023-12-05 DIAGNOSIS — I493 Ventricular premature depolarization: Secondary | ICD-10-CM | POA: Diagnosis not present
